# Patient Record
Sex: Female | Born: 1965 | Race: Black or African American | Hispanic: No | Smoking: Never smoker
Health system: Southern US, Community
[De-identification: ages and names within clinical notes are randomized; demographics above are authoritative.]

## PROBLEM LIST (undated history)

## (undated) DIAGNOSIS — E041 Nontoxic single thyroid nodule: Secondary | ICD-10-CM

## (undated) DIAGNOSIS — D649 Anemia, unspecified: Secondary | ICD-10-CM

## (undated) DIAGNOSIS — R87619 Unspecified abnormal cytological findings in specimens from cervix uteri: Secondary | ICD-10-CM

## (undated) DIAGNOSIS — N979 Female infertility, unspecified: Secondary | ICD-10-CM

## (undated) DIAGNOSIS — B9689 Other specified bacterial agents as the cause of diseases classified elsewhere: Secondary | ICD-10-CM

## (undated) DIAGNOSIS — N760 Acute vaginitis: Secondary | ICD-10-CM

## (undated) DIAGNOSIS — R319 Hematuria, unspecified: Secondary | ICD-10-CM

## (undated) HISTORY — PX: CRYOTHERAPY: SHX1416

## (undated) HISTORY — DX: Nontoxic single thyroid nodule: E04.1

## (undated) HISTORY — DX: Anemia, unspecified: D64.9

## (undated) HISTORY — DX: Other specified bacterial agents as the cause of diseases classified elsewhere: B96.89

## (undated) HISTORY — DX: Acute vaginitis: N76.0

## (undated) HISTORY — DX: Female infertility, unspecified: N97.9

## (undated) HISTORY — DX: Unspecified abnormal cytological findings in specimens from cervix uteri: R87.619

## (undated) HISTORY — DX: Hematuria, unspecified: R31.9

---

## 1985-04-27 HISTORY — PX: GYNECOLOGIC CRYOSURGERY: SHX857

## 1993-05-28 HISTORY — PX: THYROID LOBECTOMY: SHX420

## 1998-07-04 ENCOUNTER — Other Ambulatory Visit: Admission: RE | Admit: 1998-07-04 | Discharge: 1998-07-04 | Payer: Self-pay | Admitting: Obstetrics and Gynecology

## 1999-07-25 ENCOUNTER — Other Ambulatory Visit: Admission: RE | Admit: 1999-07-25 | Discharge: 1999-07-25 | Payer: Self-pay | Admitting: Obstetrics and Gynecology

## 2000-08-10 ENCOUNTER — Encounter: Payer: Self-pay | Admitting: Obstetrics and Gynecology

## 2000-08-10 ENCOUNTER — Encounter: Admission: RE | Admit: 2000-08-10 | Discharge: 2000-08-10 | Payer: Self-pay | Admitting: Obstetrics and Gynecology

## 2001-08-08 ENCOUNTER — Other Ambulatory Visit: Admission: RE | Admit: 2001-08-08 | Discharge: 2001-08-08 | Payer: Self-pay | Admitting: *Deleted

## 2002-08-17 ENCOUNTER — Other Ambulatory Visit: Admission: RE | Admit: 2002-08-17 | Discharge: 2002-08-17 | Payer: Self-pay | Admitting: Obstetrics and Gynecology

## 2003-08-20 ENCOUNTER — Other Ambulatory Visit: Admission: RE | Admit: 2003-08-20 | Discharge: 2003-08-20 | Payer: Self-pay | Admitting: Obstetrics and Gynecology

## 2004-08-21 ENCOUNTER — Other Ambulatory Visit: Admission: RE | Admit: 2004-08-21 | Discharge: 2004-08-21 | Payer: Self-pay | Admitting: Obstetrics and Gynecology

## 2004-08-22 ENCOUNTER — Encounter: Admission: RE | Admit: 2004-08-22 | Discharge: 2004-08-22 | Payer: Self-pay | Admitting: Obstetrics and Gynecology

## 2005-08-21 ENCOUNTER — Other Ambulatory Visit: Admission: RE | Admit: 2005-08-21 | Discharge: 2005-08-21 | Payer: Self-pay | Admitting: Obstetrics and Gynecology

## 2005-08-25 ENCOUNTER — Encounter: Admission: RE | Admit: 2005-08-25 | Discharge: 2005-08-25 | Payer: Self-pay | Admitting: Obstetrics and Gynecology

## 2005-09-14 ENCOUNTER — Encounter: Admission: RE | Admit: 2005-09-14 | Discharge: 2005-09-14 | Payer: Self-pay | Admitting: Obstetrics and Gynecology

## 2006-03-02 ENCOUNTER — Encounter: Admission: RE | Admit: 2006-03-02 | Discharge: 2006-03-02 | Payer: Self-pay | Admitting: Unknown Physician Specialty

## 2006-08-23 ENCOUNTER — Other Ambulatory Visit: Admission: RE | Admit: 2006-08-23 | Discharge: 2006-08-23 | Payer: Self-pay | Admitting: Obstetrics and Gynecology

## 2006-08-31 ENCOUNTER — Encounter: Admission: RE | Admit: 2006-08-31 | Discharge: 2006-08-31 | Payer: Self-pay | Admitting: Obstetrics and Gynecology

## 2007-09-05 ENCOUNTER — Encounter: Admission: RE | Admit: 2007-09-05 | Discharge: 2007-09-05 | Payer: Self-pay | Admitting: Obstetrics and Gynecology

## 2007-11-15 ENCOUNTER — Encounter: Admission: RE | Admit: 2007-11-15 | Discharge: 2007-11-15 | Payer: Self-pay | Admitting: Endocrinology

## 2008-07-03 ENCOUNTER — Other Ambulatory Visit: Admission: RE | Admit: 2008-07-03 | Discharge: 2008-07-03 | Payer: Self-pay | Admitting: Obstetrics & Gynecology

## 2008-09-05 ENCOUNTER — Encounter: Admission: RE | Admit: 2008-09-05 | Discharge: 2008-09-05 | Payer: Self-pay | Admitting: Obstetrics and Gynecology

## 2009-09-06 ENCOUNTER — Encounter: Admission: RE | Admit: 2009-09-06 | Discharge: 2009-09-06 | Payer: Self-pay | Admitting: Obstetrics and Gynecology

## 2010-08-06 ENCOUNTER — Other Ambulatory Visit: Payer: Self-pay | Admitting: Obstetrics and Gynecology

## 2010-08-06 DIAGNOSIS — Z1231 Encounter for screening mammogram for malignant neoplasm of breast: Secondary | ICD-10-CM

## 2010-09-08 ENCOUNTER — Ambulatory Visit
Admission: RE | Admit: 2010-09-08 | Discharge: 2010-09-08 | Disposition: A | Discharge status: Home / Self Care | Payer: BC Managed Care – PPO | Source: Ambulatory Visit | Attending: Obstetrics and Gynecology | Admitting: Obstetrics and Gynecology

## 2010-09-08 DIAGNOSIS — Z1231 Encounter for screening mammogram for malignant neoplasm of breast: Secondary | ICD-10-CM

## 2011-08-11 LAB — HM PAP SMEAR: HM Pap smear: NEGATIVE

## 2011-09-01 ENCOUNTER — Other Ambulatory Visit: Payer: Self-pay | Admitting: Obstetrics and Gynecology

## 2011-09-01 DIAGNOSIS — Z1231 Encounter for screening mammogram for malignant neoplasm of breast: Secondary | ICD-10-CM

## 2011-09-10 ENCOUNTER — Ambulatory Visit
Admission: RE | Admit: 2011-09-10 | Discharge: 2011-09-10 | Disposition: A | Discharge status: Home / Self Care | Payer: BC Managed Care – PPO | Source: Ambulatory Visit | Attending: Obstetrics and Gynecology | Admitting: Obstetrics and Gynecology

## 2011-09-10 DIAGNOSIS — Z1231 Encounter for screening mammogram for malignant neoplasm of breast: Secondary | ICD-10-CM

## 2011-09-10 LAB — HM MAMMOGRAPHY: HM Mammogram: NEGATIVE

## 2012-08-01 ENCOUNTER — Other Ambulatory Visit: Payer: Self-pay | Admitting: Nurse Practitioner

## 2012-08-01 NOTE — Telephone Encounter (Signed)
Annual Exam 08/15/12

## 2012-08-09 ENCOUNTER — Other Ambulatory Visit: Payer: Self-pay

## 2012-08-09 DIAGNOSIS — Z1231 Encounter for screening mammogram for malignant neoplasm of breast: Secondary | ICD-10-CM

## 2012-08-15 ENCOUNTER — Encounter: Payer: Self-pay | Admitting: *Deleted

## 2012-08-15 ENCOUNTER — Encounter: Payer: Self-pay | Admitting: Nurse Practitioner

## 2012-08-15 ENCOUNTER — Ambulatory Visit (INDEPENDENT_AMBULATORY_CARE_PROVIDER_SITE_OTHER): Payer: PRIVATE HEALTH INSURANCE | Admitting: Nurse Practitioner

## 2012-08-15 VITALS — BP 112/64 | HR 68 | Ht 64.0 in | Wt 159.8 lb

## 2012-08-15 DIAGNOSIS — Z0000 Encounter for general adult medical examination without abnormal findings: Secondary | ICD-10-CM

## 2012-08-15 DIAGNOSIS — IMO0001 Reserved for inherently not codable concepts without codable children: Secondary | ICD-10-CM

## 2012-08-15 DIAGNOSIS — E039 Hypothyroidism, unspecified: Secondary | ICD-10-CM

## 2012-08-15 DIAGNOSIS — Z309 Encounter for contraceptive management, unspecified: Secondary | ICD-10-CM

## 2012-08-15 DIAGNOSIS — Z01419 Encounter for gynecological examination (general) (routine) without abnormal findings: Secondary | ICD-10-CM

## 2012-08-15 LAB — POCT URINALYSIS DIPSTICK
Leukocytes, UA: NEGATIVE
Spec Grav, UA: 1.01
Urobilinogen, UA: NEGATIVE
pH, UA: 7

## 2012-08-15 MED ORDER — DROSPIRENONE-ETHINYL ESTRADIOL 3-0.02 MG PO TABS: 1.0000 | ORAL_TABLET | Freq: Every day | ORAL | Status: DC

## 2012-08-15 MED ORDER — LEVOTHYROXINE SODIUM 75 MCG PO TABS: 75.0000 ug | ORAL_TABLET | Freq: Every day | ORAL | Status: DC

## 2012-08-15 NOTE — Progress Notes (Signed)
47 y.o. G1P1 Married African American Fe here for annual exam. Menses are regular at 28 days, last 3 days.  Moderate to light. Still cramps for 2 days.  Labs done at work and will get TSH results sent here. No new health problems. Has history of anemia and admits to having a poor diet.  No LMP recorded.          Sexually active: yes  The current method of family planning is OCP (estrogen/progesterone).    Exercising: yes  Home exercise routine includes dance, cardio. Smoker:  no  Health Maintenance: Pap:  08/11/2011 normal with neg HR HPV MMG:  09/10/2011 normal Colonoscopy:  never TDaP:  08/11/2011 Labs: Hgb-10.9     Past Medical History  Diagnosis Date  . Infertility, female   . Bacterial vaginosis   . Hematuria   . Anemia   . Thyroid nodule     LEFT 7/09    Past Surgical History  Procedure Laterality Date  . Thyroid lobectomy Right 2/95  . Gynecologic cryosurgery  1987    Current Outpatient Prescriptions  Medication Sig Dispense Refill  . Levothyroxine Sodium (SYNTHROID PO) Take 0.75 mcg by mouth daily.      Devonne Doughty 3-0.02 MG tablet take 1 tablet by mouth daily  84 tablet  0  . Multiple Vitamin (MULTI-VITAMIN PO) Take by mouth daily.       No current facility-administered medications for this visit.    Family History  Problem Relation Age of Onset  . Cancer Mother 79    ovarian cancer  . Diabetes Mother   . Cancer Father 55    prostate cancer    ROS:  Pertinent items are noted in HPI.  Otherwise, a comprehensive ROS was negative.  Exam:   There were no vitals taken for this visit.    Ht Readings from Last 3 Encounters:  No data found for Ht    General appearance: alert, cooperative and appears stated age Head: Normocephalic, without obvious abnormality, atraumatic Neck: no adenopathy, supple, symmetrical, trachea midline and thyroid normal to inspection and palpation Lungs: clear to auscultation bilaterally Breasts: normal appearance, no masses or  tenderness Heart: regular rate and rhythm Abdomen: soft, non-tender; no masses,  no organomegaly Extremities: extremities normal, atraumatic, no cyanosis or edema Skin: Skin color, texture, turgor normal. No rashes or lesions Lymph nodes: Cervical, supraclavicular, and axillary nodes normal. No abnormal inguinal nodes palpated Neurologic: Grossly normal   Pelvic: External genitalia:  no lesions              Urethra:  normal appearing urethra with no masses, tenderness or lesions              Bartholin's and Skene's: normal                 Vagina: normal appearing vagina with normal color and discharge, no lesions              Cervix: anteverted              Pap taken: no Bimanual Exam:  Uterus:  normal size, contour, position, consistency, mobility, non-tender              Adnexa: no mass, fullness, tenderness               Rectovaginal: Confirms               Anus:  normal sphincter tone, no lesions  A:  Well Woman with normal  exam  Oral contraception  History of anemia  Hypothyroid on replacement therapy    P:   Mammogram as scheduled  pap smear as per guidelines  return annually or prn  An After Visit Summary was printed and given to the patient.

## 2012-08-15 NOTE — Patient Instructions (Addendum)
EXERCISE AND DIET:  We recommended that you start or continue a regular exercise program for good health. Regular exercise means any activity that makes your heart beat faster and makes you sweat.  We recommend exercising at least 30 minutes per day at least 3 days a week, preferably 4 or 5.  We also recommend a diet low in fat and sugar.  Inactivity, poor dietary choices and obesity can cause diabetes, heart attack, stroke, and kidney damage, among others.    ALCOHOL AND SMOKING:  Women should limit their alcohol intake to no more than 7 drinks/beers/glasses of wine (combined, not each!) per week. Moderation of alcohol intake to this level decreases your risk of breast cancer and liver damage. And of course, no recreational drugs are part of a healthy lifestyle.  And absolutely no smoking or even second hand smoke. Most people know smoking can cause heart and lung diseases, but did you know it also contributes to weakening of your bones? Aging of your skin?  Yellowing of your teeth and nails?  CALCIUM AND VITAMIN D:  Adequate intake of calcium and Vitamin D are recommended.  The recommendations for exact amounts of these supplements seem to change often, but generally speaking 600 mg of calcium (either carbonate or citrate) and 800 units of Vitamin D per day seems prudent. Certain women may benefit from higher intake of Vitamin D.  If you are among these women, your doctor will have told you during your visit.    PAP SMEARS:  Pap smears, to check for cervical cancer or precancers,  have traditionally been done yearly, although recent scientific advances have shown that most women can have pap smears less often.  However, every woman still should have a physical exam from her gynecologist every year. It will include a breast check, inspection of the vulva and vagina to check for abnormal growths or skin changes, a visual exam of the cervix, and then an exam to evaluate the size and shape of the uterus and  ovaries.  And after 48 years of age, a rectal exam is indicated to check for rectal cancers. We will also provide age appropriate advice regarding health maintenance, like when you should have certain vaccines, screening for sexually transmitted diseases, bone density testing, colonoscopy, mammograms, etc.   MAMMOGRAMS:  All women over 40 years old should have a yearly mammogram. Many facilities now offer a "3D" mammogram, which may cost around $50 extra out of pocket. If possible,  we recommend you accept the option to have the 3D mammogram performed.  It both reduces the number of women who will be called back for extra views which then turn out to be normal, and it is better than the routine mammogram at detecting truly abnormal areas.    COLONOSCOPY:  Colonoscopy to screen for colon cancer is recommended for all women at age 50.  We know, you hate the idea of the prep.  We agree, BUT, having colon cancer and not knowing it is worse!!  Colon cancer so often starts as a polyp that can be seen and removed at colonscopy, which can quite literally save your life!  And if your first colonoscopy is normal and you have no family history of colon cancer, most women don't have to have it again for 10 years.  Once every ten years, you can do something that may end up saving your life, right?  We will be happy to help you get it scheduled when you are ready.    Be sure to check your insurance coverage so you understand how much it will cost.  It may be covered as a preventative service at no cost, but you should check your particular policy.    Anemia, Frequently Asked Questions WHAT ARE THE SYMPTOMS OF ANEMIA?  Headache.  Difficulty thinking.  Fatigue.  Shortness of breath.  Weakness.  Rapid heartbeat. AT WHAT POINT ARE PEOPLE CONSIDERED ANEMIC?  This varies with gender and age.   Both hemoglobin (Hgb) and hematocrit values are used to define anemia. These lab values are obtained from a complete blood  count (CBC) test. This is performed at a caregiver's office.  The normal range of hemoglobin values for adult men is 14.0 g/dL to 16.1 g/dL. For nonpregnant women, values are 12.3 g/dL to 09.6 g/dL.  The World Health Organization defines anemia as less than 12 g/dL for nonpregnant women and less than 13 g/dL for men.  For adult males, the average normal hematocrit is 46%, and the range is 40% to 52%.  For adult females, the average normal hematocrit is 41%, and the range is 35% to 47%.  Values that fall below the lower limits can be a sign of anemia and should have further checking (evaluation). GROUPS OF PEOPLE WHO ARE AT RISK FOR DEVELOPING ANEMIA INCLUDE:   Infants who are breastfed or taking a formula that is not fortified with iron.  Children going through a rapid growth spurt. The iron available can not keep up with the needs for a red cell mass which must grow with the child.  Women in childbearing years. They need iron because of blood loss during menstruation.  Pregnant women. The growing fetus creates a high demand for iron.  People with ongoing gastrointestinal blood loss are at risk of developing iron deficiency.  Individuals with leukemia or cancer who must receive chemotherapy or radiation to treat their disease. The drugs or radiation used to treat these diseases often decreases the bone marrow's ability to make cells of all classes. This includes red blood cells, white blood cells, and platelets.  Individuals with chronic inflammatory conditions such as rheumatoid arthritis or chronic infections.  The elderly. ARE SOME TYPES OF ANEMIA INHERITED?   Yes, some types of anemia are due to inherited or genetic defects.  Sickle cell anemia. This occurs most often in people of African, African American, and Mediterranean descent.  Thalassemia (or Cooley's anemia). This type is found in people of Mediterranean and Southeast Asian descent. These types of anemia are  common.  [place]i. This is rare. CAN CERTAIN MEDICATIONS CAUSE A PERSON TO BECOME ANEMIC?  Yes. For example, drugs to fight cancer (chemotherapeutic agents) often cause anemia. These drugs can slow the bone marrow's ability to make red blood cells. If there are not enough red blood cells, the body does not get enough oxygen. WHAT HEMATOCRIT LEVEL IS REQUIRED TO DONATE BLOOD?  The lower limit of an acceptable hematocrit for blood donors is 38%. If you have a low hematocrit value, you should schedule an appointment with your caregiver. ARE BLOOD TRANSFUSIONS COMMONLY USED TO CORRECT ANEMIA, AND ARE THEY DANGEROUS?  They are used to treat anemia as a last resort. Your caregiver will find the cause of the anemia and correct it if possible. Most blood transfusions are given because of excessive bleeding at the time of surgery, with trauma, or because of bone marrow suppression in patients with cancer or leukemia on chemotherapy. Blood transfusions are safer than ever before. We also know  that blood transfusions affect the immune system and may increase certain risks. There is also a concern for human error. In 1/16,000 transfusions, a patient receives a transfusion of blood that is not matched with his or her blood type.  WHAT IS IRON DEFICIENCY ANEMIA AND CAN I CORRECT IT BY CHANGING MY DIET?  Iron is an essential part of hemoglobin. Without enough hemoglobin, anemia develops and the body does not get the right amount of oxygen. Iron deficiency anemia develops after the body has had a low level of iron for a long time. This is either caused by blood loss, not taking in or absorbing enough iron, or increased demands for iron (like pregnancy or rapid growth).  Foods from animal origin such as beef, chicken, and pork, are good sources of iron. Be sure to have one of these foods at each meal. Vitamin C helps your body absorb iron. Foods rich in Vitamin C include citrus, bell pepper, strawberries, spinach and  cantaloupe. In some cases, iron supplements may be needed in order to correct the iron deficiency. In the case of poor absorption, extra iron may have to be given directly into the vein through a needle (intravenously). I HAVE BEEN DIAGNOSED WITH IRON DEFICIENCY ANEMIA AND MY CAREGIVER PRESCRIBED IRON SUPPLEMENTS. HOW LONG WILL IT TAKE FOR MY BLOOD TO BECOME NORMAL?  It depends on the degree of anemia at the beginning of treatment. Most people with mild to moderate iron deficiency, anemia will correct the anemia over a period of 2 to 3 months. But after the anemia is corrected, the iron stored by the body is still low. Caregivers often suggest an additional 6 months of oral iron therapy once the anemia has been reversed. This will help prevent the iron deficiency anemia from quickly happening again. Non-anemic adult males should take iron supplements only under the direction of a doctor, too much iron can cause liver damage.  MY HEMOGLOBIN IS 9 G/DL AND I AM SCHEDULED FOR SURGERY. SHOULD I POSTPONE THE SURGERY?  If you have Hgb of 9, you should discuss this with your caregiver right away. Many patients with similar hemoglobin levels have had surgery without problems. If minimal blood loss is expected for a minor procedure, no treatment may be necessary.  If a greater blood loss is expected for more extensive procedures, you should ask your caregiver about being treated with erythropoietin and iron. This is to accelerate the recovery of your hemoglobin to a normal level before surgery. An anemic patient who undergoes high-blood-loss surgery has a greater risk of surgical complications and need for a blood transfusion, which also carries some risk.  I HAVE BEEN TOLD THAT HEAVY MENSTRUAL PERIODS CAUSE ANEMIA. IS THERE ANYTHING I CAN DO TO PREVENT THE ANEMIA?  Anemia that results from heavy periods is usually due to iron deficiency. You can try to meet the increased demands for iron caused by the heavy monthly  blood loss by increasing the intake of iron-rich foods. Iron supplements may be required. Discuss your concerns with your caregiver. WHAT CAUSES ANEMIA DURING PREGNANCY?  Pregnancy places major demands on the body. The mother must meet the needs of both her body and her growing baby. The body needs enough iron and folate to make the right amount of red blood cells. To prevent anemia while pregnant, the mother should stay in close contact with her caregiver.  Be sure to eat a diet that has foods rich in iron and folate like liver and dark green  leafy vegetables. Folate plays an important role in the normal development of a baby's spinal cord. Folate can help prevent serious disorders like spina bifida. If your diet does not provide adequate nutrients, you may want to talk with your caregiver about nutritional supplements.  WHAT IS THE RELATIONSHIP BETWEEN FIBROID TUMORS AND ANEMIA IN WOMEN?  The relationship is usually caused by the increased menstrual blood loss caused by fibroids. Good iron intake may be required to prevent iron deficiency anemia from developing.  Document Released: 11/20/2003 Document Revised: 07/06/2011 Document Reviewed: 05/06/2010 [facility] Patient Information 2013 [place], [place].

## 2012-08-16 LAB — HEMOGLOBIN, FINGERSTICK: Hemoglobin, fingerstick: 10.9 g/dL — ABNORMAL LOW (ref 12.0–16.0)

## 2012-08-16 NOTE — Progress Notes (Signed)
Encounter reviewed by Dr. Brook Silva.  

## 2012-09-12 ENCOUNTER — Ambulatory Visit
Admission: RE | Admit: 2012-09-12 | Discharge: 2012-09-12 | Disposition: A | Discharge status: Home / Self Care | Payer: PRIVATE HEALTH INSURANCE | Source: Ambulatory Visit

## 2012-09-12 DIAGNOSIS — Z1231 Encounter for screening mammogram for malignant neoplasm of breast: Secondary | ICD-10-CM

## 2013-08-17 ENCOUNTER — Ambulatory Visit (INDEPENDENT_AMBULATORY_CARE_PROVIDER_SITE_OTHER): Payer: PRIVATE HEALTH INSURANCE | Admitting: Nurse Practitioner

## 2013-08-17 ENCOUNTER — Encounter: Payer: Self-pay | Admitting: Nurse Practitioner

## 2013-08-17 VITALS — BP 130/82 | HR 60 | Ht 63.25 in | Wt 167.0 lb

## 2013-08-17 DIAGNOSIS — E039 Hypothyroidism, unspecified: Secondary | ICD-10-CM

## 2013-08-17 DIAGNOSIS — Z01419 Encounter for gynecological examination (general) (routine) without abnormal findings: Secondary | ICD-10-CM

## 2013-08-17 DIAGNOSIS — Z309 Encounter for contraceptive management, unspecified: Secondary | ICD-10-CM

## 2013-08-17 DIAGNOSIS — IMO0001 Reserved for inherently not codable concepts without codable children: Secondary | ICD-10-CM

## 2013-08-17 DIAGNOSIS — Z0000 Encounter for general adult medical examination without abnormal findings: Secondary | ICD-10-CM

## 2013-08-17 LAB — HEMOGLOBIN, FINGERSTICK: Hemoglobin, fingerstick: 10.8 g/dL — ABNORMAL LOW (ref 12.0–16.0)

## 2013-08-17 LAB — POCT URINALYSIS DIPSTICK
Bilirubin, UA: NEGATIVE
Glucose, UA: NEGATIVE
Ketones, UA: NEGATIVE
Leukocytes, UA: NEGATIVE
Nitrite, UA: NEGATIVE
Protein, UA: NEGATIVE
Urobilinogen, UA: NEGATIVE
pH, UA: 5

## 2013-08-17 MED ORDER — LEVOTHYROXINE SODIUM 75 MCG PO TABS: 75.0000 ug | ORAL_TABLET | Freq: Every day | ORAL | Status: DC

## 2013-08-17 MED ORDER — DROSPIRENONE-ETHINYL ESTRADIOL 3-0.02 MG PO TABS: 1.0000 | ORAL_TABLET | Freq: Every day | ORAL | Status: DC

## 2013-08-17 NOTE — Patient Instructions (Signed)

## 2013-08-17 NOTE — Progress Notes (Signed)
Patient ID: Dawn Trevino, female   DOB: 1965/10/14, 48 y.o.   MRN: [id number] 48 y.o. G1P1 Married African American Fe here for annual exam.  Menses last very light for 2-3 days.  Noted more cramps past 2 months.  Patient's last menstrual period was 08/13/2013.          Sexually active: yes  The current method of family planning is OCP (estrogen/progesterone).    Exercising: yes  zumba and walking Smoker:  no  Health Maintenance: Pap:  08/11/2011 WNL, neg HR HPV MMG:  09/13/2012 normal TDaP:  08/11/2011 Labs: HB:  10.8 Urine:  Large RBC - on menses    reports that she has never smoked. She has never used smokeless tobacco. She reports that she drinks about 1.2 ounces of alcohol per week. She reports that she does not use illicit drugs.  Past Medical History  Diagnosis Date  . Infertility, female   . Bacterial vaginosis   . Hematuria   . Anemia   . Thyroid nodule     LEFT 7/09    Past Surgical History  Procedure Laterality Date  . Thyroid lobectomy Right 2/95  . Gynecologic cryosurgery  1987    Current Outpatient Prescriptions  Medication Sig Dispense Refill  . drospirenone-ethinyl estradiol (LORYNA) 3-0.02 MG tablet Take 1 tablet by mouth daily.  84 tablet  3  . levothyroxine (SYNTHROID) 75 MCG tablet Take 1 tablet (75 mcg total) by mouth daily before breakfast.  90 tablet  3  . Multiple Vitamin (MULTI-VITAMIN PO) Take by mouth daily.       No current facility-administered medications for this visit.    Family History  Problem Relation Age of Onset  . Cancer Mother 22    ovarian cancer  . Diabetes Mother   . Cancer Father 33    prostate cancer  . Cancer Brother 41    liver cancer    ROS:  Pertinent items are noted in HPI.  Otherwise, a comprehensive ROS was negative.  Exam:   BP 130/82  Pulse 60  Ht 5' 3.25" (1.607 m)  Wt 167 lb (75.751 kg)  BMI 29.33 kg/m2  LMP 08/13/2013 Height: 5' 3.25" (160.7 cm)  Ht Readings from Last 3 Encounters:  08/17/13 5'  3.25" (1.607 m)  08/15/12 5\' 4"  (1.626 m)    General appearance: alert, cooperative and appears stated age Head: Normocephalic, without obvious abnormality, atraumatic Neck: no adenopathy, supple, symmetrical, trachea midline and thyroid normal to inspection and palpation Lungs: clear to auscultation bilaterally Breasts: normal appearance, no masses or tenderness Heart: regular rate and rhythm Abdomen: soft, non-tender; no masses,  no organomegaly Extremities: extremities normal, atraumatic, no cyanosis or edema Skin: Skin color, texture, turgor normal. No rashes or lesions Lymph nodes: Cervical, supraclavicular, and axillary nodes normal. No abnormal inguinal nodes palpated Neurologic: Grossly normal   Pelvic: External genitalia:  no lesions              Urethra:  normal appearing urethra with no masses, tenderness or lesions              Bartholin's and Skene's: normal                 Vagina: normal appearing vagina with normal color and discharge, no lesions              Cervix: anteverted              Pap taken: no Bimanual Exam:  Uterus:  normal  size, contour, position, consistency, mobility, non-tender              Adnexa: no mass, fullness, tenderness               Rectovaginal: Confirms               Anus:  normal sphincter tone, no lesions  A:  Well Woman with normal exam  Contraception with OCP  History of anemia - currently off Fe  History of hypothyroid on replacement  History of hematuria with negative evaluation  P:   Reviewed health and wellness pertinent to exam  Pap smear not taken today  Mammogram due 08/2013  Refill OCP - Loryna for a year  Refill on Synthroid and follow with labs  Will get copy of labs from 01/2013 at work and send here  [place] on breast self exam, mammography screening, use and side effects of OCP's, adequate intake of calcium and vitamin D, diet and exercise return annually or prn  An After Visit Summary was printed and given to the  patient.

## 2013-08-18 LAB — TSH: TSH: 1.013 u[IU]/mL (ref 0.350–4.500)

## 2013-08-20 NOTE — Progress Notes (Signed)
Encounter reviewed by Dr. Cleave Ternes Silva.  

## 2013-08-22 ENCOUNTER — Other Ambulatory Visit: Payer: Self-pay

## 2013-08-22 DIAGNOSIS — Z1231 Encounter for screening mammogram for malignant neoplasm of breast: Secondary | ICD-10-CM

## 2013-09-13 ENCOUNTER — Ambulatory Visit
Admission: RE | Admit: 2013-09-13 | Discharge: 2013-09-13 | Disposition: A | Discharge status: Home / Self Care | Payer: PRIVATE HEALTH INSURANCE | Source: Ambulatory Visit

## 2013-09-13 DIAGNOSIS — Z1231 Encounter for screening mammogram for malignant neoplasm of breast: Secondary | ICD-10-CM

## 2013-09-14 ENCOUNTER — Other Ambulatory Visit: Payer: Self-pay | Admitting: Nurse Practitioner

## 2013-09-14 DIAGNOSIS — R928 Other abnormal and inconclusive findings on diagnostic imaging of breast: Secondary | ICD-10-CM

## 2013-09-27 ENCOUNTER — Other Ambulatory Visit: Payer: Self-pay | Admitting: Nurse Practitioner

## 2013-09-27 ENCOUNTER — Ambulatory Visit
Admission: RE | Admit: 2013-09-27 | Discharge: 2013-09-27 | Disposition: A | Discharge status: Home / Self Care | Payer: PRIVATE HEALTH INSURANCE | Source: Ambulatory Visit | Attending: Nurse Practitioner | Admitting: Nurse Practitioner

## 2013-09-27 ENCOUNTER — Encounter (INDEPENDENT_AMBULATORY_CARE_PROVIDER_SITE_OTHER): Payer: Self-pay

## 2013-09-27 DIAGNOSIS — R928 Other abnormal and inconclusive findings on diagnostic imaging of breast: Secondary | ICD-10-CM

## 2014-02-26 ENCOUNTER — Encounter: Payer: Self-pay | Admitting: Nurse Practitioner

## 2014-08-21 ENCOUNTER — Encounter: Payer: Self-pay | Admitting: Nurse Practitioner

## 2014-08-21 ENCOUNTER — Ambulatory Visit (INDEPENDENT_AMBULATORY_CARE_PROVIDER_SITE_OTHER): Payer: PRIVATE HEALTH INSURANCE | Admitting: Nurse Practitioner

## 2014-08-21 VITALS — BP 108/66 | HR 64 | Ht 63.0 in | Wt 169.0 lb

## 2014-08-21 DIAGNOSIS — Z0000 Encounter for general adult medical examination without abnormal findings: Secondary | ICD-10-CM | POA: Diagnosis not present

## 2014-08-21 DIAGNOSIS — Z01419 Encounter for gynecological examination (general) (routine) without abnormal findings: Secondary | ICD-10-CM | POA: Diagnosis not present

## 2014-08-21 DIAGNOSIS — Z308 Encounter for other contraceptive management: Secondary | ICD-10-CM

## 2014-08-21 DIAGNOSIS — Z1211 Encounter for screening for malignant neoplasm of colon: Secondary | ICD-10-CM | POA: Diagnosis not present

## 2014-08-21 MED ORDER — DROSPIRENONE-ETHINYL ESTRADIOL 3-0.02 MG PO TABS: 1.0000 | ORAL_TABLET | Freq: Every day | ORAL | Status: DC

## 2014-08-21 NOTE — Progress Notes (Signed)
Patient ID: Dawn Trevino, female   DOB: 22-Jul-1965, 49 y.o.   MRN: [id number] 49 y.o. G1P1 Married  African American Fe here for annual exam.  No new health problems.  Menses is short about 3-4 days and light. No vaso symptoms.  Patient's last menstrual period was 08/18/2014 (exact date).          Sexually active: Yes.    The current method of family planning is OCP (estrogen/progesterone).    Exercising: Yes.    walking and zumba Smoker:  no  Health Maintenance: Pap:  08/11/11, negative with neg HR HPV MMG:  09/13/13 with diagnostic right on 09/27/13; Bi-Rads 1:  Negative, screening in one year Colonoscopy:  Due  TDaP:  08/11/11 Labs:  HB:  Work labs  Urine:  declined   reports that she has never smoked. She has never used smokeless tobacco. She reports that she drinks about 1.2 oz of alcohol per week. She reports that she does not use illicit drugs.  Past Medical History  Diagnosis Date  . Infertility, female   . Bacterial vaginosis   . Hematuria   . Anemia   . Thyroid nodule     LEFT 7/09    Past Surgical History  Procedure Laterality Date  . Thyroid lobectomy Right 2/95  . Gynecologic cryosurgery  1987    abnormal pap - normal since    Current Outpatient Prescriptions  Medication Sig Dispense Refill  . drospirenone-ethinyl estradiol (LORYNA) 3-0.02 MG tablet Take 1 tablet by mouth daily. 3 Package 3  . levothyroxine (SYNTHROID) 75 MCG tablet Take 1 tablet (75 mcg total) by mouth daily before breakfast. 90 tablet 3  . Multiple Vitamin (MULTI-VITAMIN PO) Take by mouth daily.     No current facility-administered medications for this visit.    Family History  Problem Relation Age of Onset  . Ovarian cancer Mother 40    ovarian cancer  . Diabetes Mother   . Prostate cancer Father 59    prostate cancer  . Liver cancer Brother 60    liver cancer  . Kidney disease Sister     both kidneys removed  . Prostate cancer Brother     ROS:  Pertinent items are noted in HPI.   Otherwise, a comprehensive ROS was negative.  Exam:   BP 108/66 mmHg  Pulse 64  Ht 5\' 3"  (1.6 m)  Wt 169 lb (76.658 kg)  BMI 29.94 kg/m2  LMP 08/18/2014 (Exact Date) Height: 5\' 3"  (160 cm) Ht Readings from Last 3 Encounters:  08/21/14 5\' 3"  (1.6 m)  08/17/13 5' 3.25" (1.607 m)  08/15/12 5\' 4"  (1.626 m)    General appearance: alert, cooperative and appears stated age Head: Normocephalic, without obvious abnormality, atraumatic Neck: no adenopathy, supple, symmetrical, trachea midline and thyroid normal to inspection and palpation Lungs: clear to auscultation bilaterally Breasts: normal appearance, no masses or tenderness Heart: regular rate and rhythm Abdomen: soft, non-tender; no masses,  no organomegaly Extremities: extremities normal, atraumatic, no cyanosis or edema Skin: Skin color, texture, turgor normal. No rashes or lesions Lymph nodes: Cervical, supraclavicular, and axillary nodes normal. No abnormal inguinal nodes palpated Neurologic: Grossly normal   Pelvic: External genitalia:  no lesions              Urethra:  normal appearing urethra with no masses, tenderness or lesions              Bartholin's and Skene's: normal  Vagina: normal appearing vagina with normal color and discharge, no lesions              Cervix: anteverted              Pap taken: Yes.   Bimanual Exam:  Uterus:  normal size, contour, position, consistency, mobility, non-tender              Adnexa: no mass, fullness, tenderness               Rectovaginal: Confirms               Anus:  normal sphincter tone, no lesions  Chaperone present: yes  A:  Well Woman with normal exam  Contraception with OCP History of anemia - currently off Fe History of hypothyroid on replacement History of hematuria with negative evaluation   P:   Reviewed health and wellness pertinent to exam  Pap smear taken today  Mammogram  Is due 08/2014  Refill OCP - Loryna  for a year  Will get GI consult with Dr. Collene Mares for screening colonoscopy  Counseled on breast self exam, mammography screening, use and side effects of OCP's, adequate intake of calcium and vitamin D, diet and exercise return annually or prn  An After Visit Summary was printed and given to the patient.

## 2014-08-21 NOTE — Patient Instructions (Signed)

## 2014-08-23 NOTE — Progress Notes (Signed)
Encounter reviewed by Dr. Brook Silva.  

## 2014-08-24 ENCOUNTER — Telehealth: Payer: Self-pay | Admitting: Nurse Practitioner

## 2014-08-24 LAB — IPS PAP TEST WITH HPV

## 2014-08-24 NOTE — Telephone Encounter (Signed)
Call to patient. Advised that she is scheduled with Dr Collene Mares at [facility] on Sep 04, 2014. Also provided her with their contact information. Patient agreeable.

## 2014-09-14 ENCOUNTER — Other Ambulatory Visit: Payer: Self-pay | Admitting: Nurse Practitioner

## 2014-09-14 NOTE — Telephone Encounter (Signed)
Medication refill request: Synthroid 75 mcg Last AEX:  08/21/14 with PG Next AEX: 08/27/15 with PG Last MMG (if hormonal medication request): n/a Refill authorized: #[id number] rfs, please advise.

## 2014-09-14 NOTE — Telephone Encounter (Signed)
levothyroxine (SYNTHROID) 75 MCG tablet [[id number]]     Order Details    Dose: 75 mcg Route: Oral Frequency: Daily before breakfast  Dispense Quantity:  90 tablet Refills:  3 Fills Remaining:  3        Sig: Take 1 tablet (75 mcg total) by mouth daily before breakfast.       Written Date:  08/17/13 Expiration Date:  08/17/14    Start Date:  08/17/13 End Date:  --    Ordering Provider:  -- Authorizing Provider:  Kem Boroughs, FNP Ordering User:  Milford Cage, FNP

## 2014-09-14 NOTE — Telephone Encounter (Signed)
rx was from 08/17/13 instead of this year.

## 2014-09-14 NOTE — Telephone Encounter (Signed)
At last AEX she got RX for a year.

## 2014-09-18 NOTE — Telephone Encounter (Signed)
Ms. Dawn Trevino please advise rx was from last year not sent for this year.

## 2014-09-19 NOTE — Telephone Encounter (Signed)
From reviewing chart she did not have TSH check prior to renewal, did not see where it was done at annual. Will need to come in for lab. Let me know if she had somewhere else and will need copy.

## 2014-09-20 MED ORDER — LEVOTHYROXINE SODIUM 75 MCG PO TABS: 75.0000 ug | ORAL_TABLET | Freq: Every day | ORAL | Status: DC

## 2014-09-20 NOTE — Addendum Note (Signed)
Addended by: Alfonzo Feller on: 09/20/2014 12:11 PM   Modules accepted: Orders

## 2014-09-20 NOTE — Telephone Encounter (Signed)
Most recent TSH we have in the system is for 02/05/2014 level was at 1.330.  Per Ms. Patty patient needs to come in for TSH recheck because we need a more recent level and that we can send a refill to last her until appointment. Called and notified patient of this and scheduled her for 09/28/14 at 9:30. Synthroid 75 mcg #30/0 rfs sent to [facility] on [address], patient is aware.

## 2014-09-21 ENCOUNTER — Other Ambulatory Visit: Payer: Self-pay

## 2014-09-21 DIAGNOSIS — Z1231 Encounter for screening mammogram for malignant neoplasm of breast: Secondary | ICD-10-CM

## 2014-09-26 ENCOUNTER — Other Ambulatory Visit (INDEPENDENT_AMBULATORY_CARE_PROVIDER_SITE_OTHER): Payer: PRIVATE HEALTH INSURANCE

## 2014-09-26 DIAGNOSIS — E039 Hypothyroidism, unspecified: Secondary | ICD-10-CM

## 2014-09-26 LAB — TSH: TSH: 0.664 u[IU]/mL (ref 0.350–4.500)

## 2014-09-27 ENCOUNTER — Other Ambulatory Visit: Payer: Self-pay | Admitting: *Deleted

## 2014-09-27 MED ORDER — LEVOTHYROXINE SODIUM 75 MCG PO TABS: 75.0000 ug | ORAL_TABLET | Freq: Every day | ORAL | Status: DC

## 2014-09-28 ENCOUNTER — Other Ambulatory Visit: Payer: PRIVATE HEALTH INSURANCE

## 2014-10-22 ENCOUNTER — Ambulatory Visit
Admission: RE | Admit: 2014-10-22 | Discharge: 2014-10-22 | Disposition: A | Discharge status: Home / Self Care | Payer: PRIVATE HEALTH INSURANCE | Source: Ambulatory Visit

## 2014-10-22 DIAGNOSIS — Z1231 Encounter for screening mammogram for malignant neoplasm of breast: Secondary | ICD-10-CM

## 2015-01-22 ENCOUNTER — Other Ambulatory Visit: Payer: Self-pay

## 2015-08-22 ENCOUNTER — Other Ambulatory Visit: Payer: Self-pay | Admitting: Nurse Practitioner

## 2015-08-22 NOTE — Telephone Encounter (Signed)
Medication refill request: DROSPIRENONE-EE 3-0.02 MG TAB Last AEX:  08/21/14 with PG Next AEX: 08/27/15 with PG Last MMG (if hormonal medication request): 10/23/2014 BI-RADS 1: neg Refill authorized: #[id number]  Routed to DL since PG is out of the office today.

## 2015-08-27 ENCOUNTER — Encounter: Payer: Self-pay | Admitting: Nurse Practitioner

## 2015-08-27 ENCOUNTER — Ambulatory Visit (INDEPENDENT_AMBULATORY_CARE_PROVIDER_SITE_OTHER): Payer: PRIVATE HEALTH INSURANCE | Admitting: Nurse Practitioner

## 2015-08-27 VITALS — BP 122/80 | HR 72 | Ht 63.0 in | Wt 172.0 lb

## 2015-08-27 DIAGNOSIS — Z0000 Encounter for general adult medical examination without abnormal findings: Secondary | ICD-10-CM

## 2015-08-27 DIAGNOSIS — E559 Vitamin D deficiency, unspecified: Secondary | ICD-10-CM | POA: Diagnosis not present

## 2015-08-27 DIAGNOSIS — Z01419 Encounter for gynecological examination (general) (routine) without abnormal findings: Secondary | ICD-10-CM

## 2015-08-27 DIAGNOSIS — E039 Hypothyroidism, unspecified: Secondary | ICD-10-CM

## 2015-08-27 LAB — TSH: TSH: 1.11 mIU/L

## 2015-08-27 MED ORDER — IBUPROFEN 800 MG PO TABS: 800.0000 mg | ORAL_TABLET | Freq: Three times a day (TID) | ORAL | Status: DC | PRN

## 2015-08-27 MED ORDER — LEVOTHYROXINE SODIUM 75 MCG PO TABS: 75.0000 ug | ORAL_TABLET | Freq: Every day | ORAL | Status: DC

## 2015-08-27 MED ORDER — DROSPIRENONE-ETHINYL ESTRADIOL 3-0.02 MG PO TABS: 1.0000 | ORAL_TABLET | Freq: Every day | ORAL | Status: DC

## 2015-08-27 NOTE — Progress Notes (Signed)
Patient ID: Dawn Trevino, female   DOB: [birth year], 50 y.o.   MRN: [id number]  50 y.o. G39P0001 Married  Caucasian Fe here for annual exam.  Menses last 3-4 days. Flow is light cramps for 2 days and takes Ibuprofen 800 mg prn. This is the year for the 'girls cruise' - they go every other year.  Patient's last menstrual period was 07/31/2015 (exact date).          Sexually active: Yes.    The current method of family planning is OCP (estrogen/progesterone).    Exercising: Yes.    walking Smoker:  no  Health Maintenance: Pap: 08/11/2011 WNL, neg HR HPV MMG:10/22/14, 3D, Bi-Rads 1: Negative Colonoscopy: Dr. Collene Mares, repeat in 10 years TDaP: 08/11/11  HIV: done today Labs: Work, will fax copy  Urine:  Not done today   reports that she has never smoked. She has never used smokeless tobacco. She reports that she drinks about 1.2 oz of alcohol per week. She reports that she does not use illicit drugs.  Past Medical History  Diagnosis Date  . Infertility, female   . Bacterial vaginosis   . Hematuria   . Anemia   . Thyroid nodule     LEFT 7/09    Past Surgical History  Procedure Laterality Date  . Thyroid lobectomy Right 2/95  . Gynecologic cryosurgery  1987    abnormal pap - normal since    Current Outpatient Prescriptions  Medication Sig Dispense Refill  . drospirenone-ethinyl estradiol (YAZ,GIANVI,LORYNA) 3-0.02 MG tablet Take 1 tablet by mouth daily. 84 tablet 4  . levothyroxine (SYNTHROID) 75 MCG tablet Take 1 tablet (75 mcg total) by mouth daily before breakfast. 90 tablet 4  . Multiple Vitamin (MULTI-VITAMIN PO) Take by mouth daily.    Marland Kitchen ibuprofen (ADVIL,MOTRIN) 800 MG tablet Take 1 tablet (800 mg total) by mouth every 8 (eight) hours as needed. 30 tablet 6   No current facility-administered medications for this visit.    Family History  Problem Relation Age of Onset  . Ovarian cancer Mother 9    ovarian cancer  . Diabetes Mother   . Prostate cancer Father 43     prostate cancer  . Liver cancer Brother 48    liver cancer  . Kidney disease Sister     both kidneys removed  . Prostate cancer Brother     ROS:  Pertinent items are noted in HPI.  Otherwise, a comprehensive ROS was negative.  Exam:   BP 122/80 mmHg  Pulse 72  Ht 5\' 3"  (1.6 m)  Wt 172 lb (78.019 kg)  BMI 30.48 kg/m2  LMP 07/31/2015 (Exact Date) Height: 5\' 3"  (160 cm) Ht Readings from Last 3 Encounters:  08/27/15 5\' 3"  (1.6 m)  08/21/14 5\' 3"  (1.6 m)  08/17/13 5' 3.25" (1.607 m)    General appearance: alert, cooperative and appears stated age Head: Normocephalic, without obvious abnormality, atraumatic Neck: no adenopathy, supple, symmetrical, trachea midline and thyroid normal to inspection and palpation Lungs: clear to auscultation bilaterally Breasts: normal appearance, no masses or tenderness Heart: regular rate and rhythm Abdomen: soft, non-tender; no masses,  no organomegaly Extremities: extremities normal, atraumatic, no cyanosis or edema Skin: Skin color, texture, turgor normal. No rashes or lesions Lymph nodes: Cervical, supraclavicular, and axillary nodes normal. No abnormal inguinal nodes palpated Neurologic: Grossly normal   Pelvic: External genitalia:  no lesions              Urethra:  normal appearing urethra with  no masses, tenderness or lesions              Bartholin's and Skene's: normal                 Vagina: normal appearing vagina with normal color and discharge, no lesions              Cervix: anteverted              Pap taken: Yes.   Bimanual Exam:  Uterus:  normal size, contour, position, consistency, mobility, non-tender              Adnexa: no mass, fullness, tenderness               Rectovaginal: Confirms               Anus:  normal sphincter tone, no lesions  Chaperone present: yes  A:  Well Woman with normal exam  Contraception with OCP History of anemia - currently off Fe History of hypothyroid on  replacement History of hematuria with negative evaluation   P:   Reviewed health and wellness pertinent to exam  Pap smear as above  mammogram counseled on breast self exam, mammography screening, use and side effects of OCP's, adequate intake of calcium and vitamin D, diet and exercise, Kegel's exercises return annually or prn  An After Visit Summary was printed and given to the patient.

## 2015-08-27 NOTE — Patient Instructions (Signed)

## 2015-08-28 LAB — HIV ANTIBODY (ROUTINE TESTING W REFLEX): HIV 1&2 Ab, 4th Generation: NONREACTIVE

## 2015-08-28 LAB — VITAMIN D 25 HYDROXY (VIT D DEFICIENCY, FRACTURES): Vit D, 25-Hydroxy: 10 ng/mL — ABNORMAL LOW (ref 30–100)

## 2015-08-29 LAB — IPS PAP TEST WITH HPV

## 2015-09-01 NOTE — Progress Notes (Signed)
Encounter reviewed by Dr. Brook Amundson C. Silva.  

## 2015-09-09 ENCOUNTER — Telehealth: Payer: Self-pay | Admitting: *Deleted

## 2015-09-09 NOTE — Telephone Encounter (Signed)
-----   Message from Kem Boroughs, [place] sent at 08/28/2015  8:35 AM EDT ----- Please let pt know that Vit D is very low again at 10.  She must go back on RX Vit D and recheck in 3 months. Several yrs ago in 2010 her Vit D was at 5.8.   In 2011 came up tp 21.6 but no higher.  She really needs to keep a check on this.

## 2015-09-09 NOTE — Telephone Encounter (Signed)
I have attempted to contact this patient by phone with the following results: left message to return call to [place] at [phone] answering machine (home per [facility]). Name verified in voicemail, advised message was regarding recent labs. [phone] (Home)

## 2015-09-10 MED ORDER — VITAMIN D (ERGOCALCIFEROL) 1.25 MG (50000 UNIT) PO CAPS: 50000.0000 [IU] | ORAL_CAPSULE | ORAL | Status: DC

## 2015-09-10 NOTE — Telephone Encounter (Signed)
I want her to take Vit D RX every 7 days.

## 2015-09-10 NOTE — Telephone Encounter (Signed)
Patient is returning a call to [place]. I informed patient that Colletta [place] has gone for the day. Patient would like a call back from anyone available that could give her the results.

## 2015-09-10 NOTE — Telephone Encounter (Signed)
Rx for Vitamin D 50,000 IU take 1 tablet q 7 days #12 0RF sent to pharmacy on file.  Routing to provider for final review. Patient agreeable to disposition. Will close encounter.

## 2015-09-10 NOTE — Telephone Encounter (Signed)
Patient returning your call, she said she will call you back later. Best # to reach: [phone]

## 2015-09-10 NOTE — Telephone Encounter (Signed)
Spoke with patient. Advised of results as seen below from Kem Boroughs, [place]. She is agreeable and verbalizes understanding. 3 month lab recheck scheduled for 12/13/2015 at 4 pm. She is agreeable to date and time.  Kem Boroughs, FNP would you like patient to take Vitamin D every 7 or 14 days with her level of 10?

## 2015-10-08 ENCOUNTER — Other Ambulatory Visit: Payer: Self-pay | Admitting: Nurse Practitioner

## 2015-10-08 DIAGNOSIS — Z1231 Encounter for screening mammogram for malignant neoplasm of breast: Secondary | ICD-10-CM

## 2015-10-23 ENCOUNTER — Ambulatory Visit
Admission: RE | Admit: 2015-10-23 | Discharge: 2015-10-23 | Disposition: A | Discharge status: Home / Self Care | Payer: No Typology Code available for payment source | Source: Ambulatory Visit | Attending: Nurse Practitioner | Admitting: Nurse Practitioner

## 2015-10-23 DIAGNOSIS — Z1231 Encounter for screening mammogram for malignant neoplasm of breast: Secondary | ICD-10-CM

## 2015-12-13 ENCOUNTER — Other Ambulatory Visit (INDEPENDENT_AMBULATORY_CARE_PROVIDER_SITE_OTHER): Payer: PRIVATE HEALTH INSURANCE

## 2015-12-13 DIAGNOSIS — E559 Vitamin D deficiency, unspecified: Secondary | ICD-10-CM

## 2015-12-13 DIAGNOSIS — Z0000 Encounter for general adult medical examination without abnormal findings: Secondary | ICD-10-CM

## 2015-12-14 LAB — VITAMIN D 25 HYDROXY (VIT D DEFICIENCY, FRACTURES): Vit D, 25-Hydroxy: 38 ng/mL (ref 30–100)

## 2015-12-16 ENCOUNTER — Telehealth: Payer: Self-pay | Admitting: *Deleted

## 2015-12-16 MED ORDER — VITAMIN D (ERGOCALCIFEROL) 1.25 MG (50000 UNIT) PO CAPS: 50000.0000 [IU] | ORAL_CAPSULE | ORAL | 1 refills | Status: DC

## 2015-12-16 NOTE — Telephone Encounter (Signed)
I have attempted to contact this patient by phone with the following results: left message to return call to [place] at [phone] on answering machine.  No personal information given.  [phone] (Mobile) *Preferred*

## 2015-12-16 NOTE — Telephone Encounter (Signed)
-----   Message from Kem Boroughs, [place] sent at 12/14/2015  8:55 AM EDT ----- Please let pt know that Vit D is so much better at 38 compared to 10.  She could go on RX Vit D every other week and will recheck at next AEX.  If Vit D stays up can then go on OTC.

## 2015-12-16 NOTE — Telephone Encounter (Signed)
Pt notified in result note.  Closing encounter. 

## 2016-01-20 ENCOUNTER — Other Ambulatory Visit: Payer: Self-pay | Admitting: Family Medicine

## 2016-01-21 LAB — CMP12+LP+TP+TSH+6AC+CBC/D/PLT
ALT: 9 IU/L (ref 0–32)
AST: 11 IU/L (ref 0–40)
Albumin/Globulin Ratio: 1.4 (ref 1.2–2.2)
Albumin: 3.9 g/dL (ref 3.5–5.5)
Alkaline Phosphatase: 42 IU/L (ref 39–117)
BUN/Creatinine Ratio: 14 (ref 9–23)
BUN: 10 mg/dL (ref 6–24)
Basophils Absolute: 0 10*3/uL (ref 0.0–0.2)
Basos: 0 %
Bilirubin Total: 0.4 mg/dL (ref 0.0–1.2)
Calcium: 8.4 mg/dL — ABNORMAL LOW (ref 8.7–10.2)
Chloride: 104 mmol/L (ref 96–106)
Chol/HDL Ratio: 2.4 ratio units (ref 0.0–4.4)
Cholesterol, Total: 150 mg/dL (ref 100–199)
Creatinine, Ser: 0.7 mg/dL (ref 0.57–1.00)
EOS (ABSOLUTE): 0 10*3/uL (ref 0.0–0.4)
Eos: 0 %
Estimated CHD Risk: <0.5 times avg. (ref 0.0–1.0)
Free Thyroxine Index: 2.6 (ref 1.2–4.9)
GFR calc Af Amer: 116 mL/min/{1.73_m2} (ref >59)
GFR calc non Af Amer: 101 mL/min/{1.73_m2} (ref >59)
GGT: 12 IU/L (ref 0–60)
Globulin, Total: 2.8 g/dL (ref 1.5–4.5)
Glucose: 87 mg/dL (ref 65–99)
HDL: 63 mg/dL (ref >39)
Hematocrit: 34.5 % (ref 34.0–46.6)
Hemoglobin: 11 g/dL — ABNORMAL LOW (ref 11.1–15.9)
Immature Grans (Abs): 0 10*3/uL (ref 0.0–0.1)
Immature Granulocytes: 0 %
Iron: 68 ug/dL (ref 27–159)
LDH: 137 IU/L (ref 119–226)
LDL Calculated: 79 mg/dL (ref 0–99)
Lymphocytes Absolute: 2 10*3/uL (ref 0.7–3.1)
Lymphs: 27 %
MCH: 29.6 pg (ref 26.6–33.0)
MCHC: 31.9 g/dL (ref 31.5–35.7)
MCV: 93 fL (ref 79–97)
Monocytes Absolute: 0.4 10*3/uL (ref 0.1–0.9)
Monocytes: 5 %
Neutrophils Absolute: 4.8 10*3/uL (ref 1.4–7.0)
Neutrophils: 68 %
Phosphorus: 3.1 mg/dL (ref 2.5–4.5)
Platelets: 381 10*3/uL — ABNORMAL HIGH (ref 150–379)
Potassium: 4.1 mmol/L (ref 3.5–5.2)
RBC: 3.71 x10E6/uL — ABNORMAL LOW (ref 3.77–5.28)
RDW: 13.4 % (ref 12.3–15.4)
Sodium: 141 mmol/L (ref 134–144)
T3 Uptake Ratio: 21 % — ABNORMAL LOW (ref 24–39)
T4, Total: 12.2 ug/dL — ABNORMAL HIGH (ref 4.5–12.0)
TSH: 0.939 u[IU]/mL (ref 0.450–4.500)
Total Protein: 6.7 g/dL (ref 6.0–8.5)
Triglycerides: 42 mg/dL (ref 0–149)
Uric Acid: 4.6 mg/dL (ref 2.5–7.1)
VLDL Cholesterol Cal: 8 mg/dL (ref 5–40)
WBC: 7.2 10*3/uL (ref 3.4–10.8)

## 2016-01-21 LAB — HGB A1C W/O EAG: Hgb A1c MFr Bld: 5.1 % (ref 4.8–5.6)

## 2016-03-05 ENCOUNTER — Other Ambulatory Visit: Payer: Self-pay | Admitting: Family Medicine

## 2016-03-06 LAB — COMPREHENSIVE METABOLIC PANEL
ALT: 9 IU/L (ref 0–32)
AST: 13 IU/L (ref 0–40)
Albumin/Globulin Ratio: 1.3 (ref 1.2–2.2)
Albumin: 3.8 g/dL (ref 3.5–5.5)
Alkaline Phosphatase: 50 IU/L (ref 39–117)
BUN/Creatinine Ratio: 13 (ref 9–23)
BUN: 9 mg/dL (ref 6–24)
Bilirubin Total: 0.4 mg/dL (ref 0.0–1.2)
CO2: 22 mmol/L (ref 18–29)
Calcium: 8.4 mg/dL — ABNORMAL LOW (ref 8.7–10.2)
Chloride: 103 mmol/L (ref 96–106)
Creatinine, Ser: 0.7 mg/dL (ref 0.57–1.00)
GFR calc Af Amer: 116 mL/min/{1.73_m2} (ref >59)
GFR calc non Af Amer: 101 mL/min/{1.73_m2} (ref >59)
Globulin, Total: 3 g/dL (ref 1.5–4.5)
Glucose: 77 mg/dL (ref 65–99)
Potassium: 4.1 mmol/L (ref 3.5–5.2)
Sodium: 143 mmol/L (ref 134–144)
Total Protein: 6.8 g/dL (ref 6.0–8.5)

## 2016-03-06 LAB — CBC WITH DIFFERENTIAL/PLATELET
Basophils Absolute: 0 10*3/uL (ref 0.0–0.2)
Basos: 0 %
EOS (ABSOLUTE): 0.1 10*3/uL (ref 0.0–0.4)
Eos: 1 %
Hematocrit: 32.3 % — ABNORMAL LOW (ref 34.0–46.6)
Hemoglobin: 10.7 g/dL — ABNORMAL LOW (ref 11.1–15.9)
Immature Grans (Abs): 0 10*3/uL (ref 0.0–0.1)
Immature Granulocytes: 0 %
Lymphocytes Absolute: 1.9 10*3/uL (ref 0.7–3.1)
Lymphs: 24 %
MCH: 30.7 pg (ref 26.6–33.0)
MCHC: 33.1 g/dL (ref 31.5–35.7)
MCV: 93 fL (ref 79–97)
Monocytes Absolute: 0.5 10*3/uL (ref 0.1–0.9)
Monocytes: 6 %
Neutrophils Absolute: 5.3 10*3/uL (ref 1.4–7.0)
Neutrophils: 69 %
Platelets: 343 10*3/uL (ref 150–379)
RBC: 3.48 x10E6/uL — ABNORMAL LOW (ref 3.77–5.28)
RDW: 13.6 % (ref 12.3–15.4)
WBC: 7.7 10*3/uL (ref 3.4–10.8)

## 2016-09-02 ENCOUNTER — Ambulatory Visit (INDEPENDENT_AMBULATORY_CARE_PROVIDER_SITE_OTHER): Payer: PRIVATE HEALTH INSURANCE | Admitting: Nurse Practitioner

## 2016-09-02 ENCOUNTER — Encounter: Payer: Self-pay | Admitting: Nurse Practitioner

## 2016-09-02 VITALS — BP 120/68 | HR 68 | Resp 16 | Ht 63.25 in | Wt 174.0 lb

## 2016-09-02 DIAGNOSIS — E559 Vitamin D deficiency, unspecified: Secondary | ICD-10-CM | POA: Diagnosis not present

## 2016-09-02 DIAGNOSIS — Z0000 Encounter for general adult medical examination without abnormal findings: Secondary | ICD-10-CM

## 2016-09-02 DIAGNOSIS — E039 Hypothyroidism, unspecified: Secondary | ICD-10-CM

## 2016-09-02 DIAGNOSIS — Z01419 Encounter for gynecological examination (general) (routine) without abnormal findings: Secondary | ICD-10-CM

## 2016-09-02 LAB — CBC
HCT: 34.2 % — ABNORMAL LOW (ref 35.0–45.0)
Hemoglobin: 10.9 g/dL — ABNORMAL LOW (ref 11.7–15.5)
MCH: 29.1 pg (ref 27.0–33.0)
MCHC: 31.9 g/dL — ABNORMAL LOW (ref 32.0–36.0)
MCV: 91.2 fL (ref 80.0–100.0)
MPV: 8.9 fL (ref 7.5–12.5)
Platelets: 359 10*3/uL (ref 140–400)
RBC: 3.75 MIL/uL — ABNORMAL LOW (ref 3.80–5.10)
RDW: 13.2 % (ref 11.0–15.0)
WBC: 8 10*3/uL (ref 3.8–10.8)

## 2016-09-02 LAB — LIPID PANEL
Cholesterol: 148 mg/dL (ref <200)
HDL: 59 mg/dL (ref >50)
LDL Cholesterol: 80 mg/dL (ref <100)
Total CHOL/HDL Ratio: 2.5 Ratio (ref <5.0)
Triglycerides: 43 mg/dL (ref <150)
VLDL: 9 mg/dL (ref <30)

## 2016-09-02 LAB — COMPREHENSIVE METABOLIC PANEL
ALT: 8 U/L (ref 6–29)
AST: 13 U/L (ref 10–35)
Albumin: 3.6 g/dL (ref 3.6–5.1)
Alkaline Phosphatase: 41 U/L (ref 33–130)
BUN: 11 mg/dL (ref 7–25)
CO2: 27 mmol/L (ref 20–31)
Calcium: 8.8 mg/dL (ref 8.6–10.4)
Chloride: 105 mmol/L (ref 98–110)
Creat: 0.81 mg/dL (ref 0.50–1.05)
Glucose, Bld: 88 mg/dL (ref 65–99)
Potassium: 3.8 mmol/L (ref 3.5–5.3)
Sodium: 139 mmol/L (ref 135–146)
Total Bilirubin: 0.4 mg/dL (ref 0.2–1.2)
Total Protein: 6.9 g/dL (ref 6.1–8.1)

## 2016-09-02 LAB — TSH: TSH: 0.78 mIU/L

## 2016-09-02 MED ORDER — IBUPROFEN 800 MG PO TABS: 800.0000 mg | ORAL_TABLET | Freq: Three times a day (TID) | ORAL | 12 refills | Status: DC | PRN

## 2016-09-02 MED ORDER — LEVOTHYROXINE SODIUM 75 MCG PO TABS: 75.0000 ug | ORAL_TABLET | Freq: Every day | ORAL | 4 refills | Status: DC

## 2016-09-02 MED ORDER — VITAMIN D (ERGOCALCIFEROL) 1.25 MG (50000 UNIT) PO CAPS: 50000.0000 [IU] | ORAL_CAPSULE | ORAL | 3 refills | Status: DC

## 2016-09-02 MED ORDER — DROSPIRENONE-ETHINYL ESTRADIOL 3-0.02 MG PO TABS: 1.0000 | ORAL_TABLET | Freq: Every day | ORAL | 4 refills | Status: DC

## 2016-09-02 NOTE — Progress Notes (Signed)
51 y.o. G51P0001 Married  Caucasian Fe here for annual exam.  Usually for the past year having very light to spotting menses.  Maybe 3-4 days.  No increase in vaso symptoms.  No vaginal dryness.  Patient's last menstrual period was 08/25/2016 (exact date).          Sexually active: Yes.    The current method of family planning is OCP (estrogen/progesterone).    Exercising: Yes.    walking Smoker:  no  Health Maintenance: Pap:  08-11-11 neg, 08-27-15 neg HPV HR neg History of Abnormal Pap: yes, cryo many years ago MMG:  10-23-15 category c density birads 1:neg Self Breast exams: yes Colonoscopy:  09/26/2014 normal f/u 85yrs TDaP:  08/11/2011 Shingles: no Pneumonia: no Hep C and HIV: HIV neg 2017 Labs:  none   reports that she has never smoked. She has never used smokeless tobacco. She reports that she does not drink alcohol or use drugs.  Past Medical History:  Diagnosis Date  . Anemia   . Bacterial vaginosis   . Hematuria   . Infertility, female   . Thyroid nodule    LEFT 7/09    Past Surgical History:  Procedure Laterality Date  . GYNECOLOGIC CRYOSURGERY  1987   abnormal pap - normal since  . THYROID LOBECTOMY Right 2/95    Current Outpatient Prescriptions  Medication Sig Dispense Refill  . drospirenone-ethinyl estradiol (YAZ,GIANVI,LORYNA) 3-0.02 MG tablet Take 1 tablet by mouth daily. 84 tablet 4  . ibuprofen (ADVIL,MOTRIN) 800 MG tablet Take 1 tablet (800 mg total) by mouth every 8 (eight) hours as needed. 30 tablet 12  . levothyroxine (SYNTHROID) 75 MCG tablet Take 1 tablet (75 mcg total) by mouth daily before breakfast. 90 tablet 4  . Multiple Vitamin (MULTI-VITAMIN PO) Take by mouth daily.    . Vitamin D, Ergocalciferol, (DRISDOL) 50000 units CAPS capsule Take 1 capsule (50,000 Units total) by mouth every 7 (seven) days. 30 capsule 3   No current facility-administered medications for this visit.     Family History  Problem Relation Age of Onset  . Ovarian cancer  Mother 6       ovarian cancer  . Diabetes Mother   . Prostate cancer Father 55       prostate cancer  . Liver cancer Brother 19       liver cancer  . Kidney disease Sister        both kidneys removed  . Prostate cancer Brother     ROS:  Pertinent items are noted in HPI.  Otherwise, a comprehensive ROS was negative.  Exam:   BP 120/68   Pulse 68   Resp 16   Ht 5' 3.25" (1.607 m)   Wt 174 lb (78.9 kg)   LMP 08/25/2016 (Exact Date)   BMI 30.58 kg/m  Height: 5' 3.25" (160.7 cm) Ht Readings from Last 3 Encounters:  09/02/16 5' 3.25" (1.607 m)  08/27/15 5\' 3"  (1.6 m)  08/21/14 5\' 3"  (1.6 m)    General appearance: alert, cooperative and appears stated age Head: Normocephalic, without obvious abnormality, atraumatic Neck: no adenopathy, supple, symmetrical, trachea midline and thyroid normal to inspection and palpation Lungs: clear to auscultation bilaterally Breasts: normal appearance, no masses or tenderness Heart: regular rate and rhythm Abdomen: soft, non-tender; no masses,  no organomegaly Extremities: extremities normal, atraumatic, no cyanosis or edema Skin: Skin color, texture, turgor normal. No rashes or lesions Lymph nodes: Cervical, supraclavicular, and axillary nodes normal. No abnormal inguinal nodes palpated  Neurologic: Grossly normal   Pelvic: External genitalia:  no lesions              Urethra:  normal appearing urethra with no masses, tenderness or lesions              Bartholin's and Skene's: normal                 Vagina: normal appearing vagina with normal color and discharge, no lesions              Cervix: anteverted              Pap taken: No. Bimanual Exam:  Uterus:  normal size, contour, position, consistency, mobility, non-tender              Adnexa: no mass, fullness, tenderness               Rectovaginal: Confirms               Anus:  normal sphincter tone, no lesions  Chaperone present: yes  A:  Well Woman with normal exam  Contraception  with OCP History of anemia - currently off Fe History of hypothyroid on replacement  History of Vit D deficiency    P:   Reviewed health and wellness pertinent to exam  Pap smear: no  Mammogram is due 09/2016  Refill on OCP for a year  Refill on Synthroid and will follow with labs  Counseled on breast self exam, mammography screening, use and side effects of OCP's, adequate intake of calcium and vitamin D, diet and exercise return annually or prn  An After Visit Summary was printed and given to the patient.

## 2016-09-02 NOTE — Patient Instructions (Addendum)

## 2016-09-03 LAB — HEPATITIS C ANTIBODY: HCV Ab: NEGATIVE

## 2016-09-03 LAB — VITAMIN D 25 HYDROXY (VIT D DEFICIENCY, FRACTURES): Vit D, 25-Hydroxy: 35 ng/mL (ref 30–100)

## 2016-09-03 NOTE — Progress Notes (Signed)
Encounter reviewed by Dr. Brook Amundson C. Silva.  

## 2016-10-09 ENCOUNTER — Other Ambulatory Visit: Payer: Self-pay | Admitting: Nurse Practitioner

## 2016-10-09 DIAGNOSIS — Z1231 Encounter for screening mammogram for malignant neoplasm of breast: Secondary | ICD-10-CM

## 2016-10-26 ENCOUNTER — Ambulatory Visit: Payer: Self-pay

## 2016-11-09 ENCOUNTER — Ambulatory Visit
Admission: RE | Admit: 2016-11-09 | Discharge: 2016-11-09 | Disposition: A | Discharge status: Home / Self Care | Payer: No Typology Code available for payment source | Source: Ambulatory Visit | Attending: Nurse Practitioner | Admitting: Nurse Practitioner

## 2016-11-09 ENCOUNTER — Ambulatory Visit: Payer: Self-pay

## 2016-11-09 DIAGNOSIS — Z1231 Encounter for screening mammogram for malignant neoplasm of breast: Secondary | ICD-10-CM

## 2017-09-06 ENCOUNTER — Ambulatory Visit: Payer: PRIVATE HEALTH INSURANCE | Admitting: Nurse Practitioner

## 2017-09-07 ENCOUNTER — Ambulatory Visit (INDEPENDENT_AMBULATORY_CARE_PROVIDER_SITE_OTHER): Payer: PRIVATE HEALTH INSURANCE | Admitting: Certified Nurse Midwife

## 2017-09-07 ENCOUNTER — Encounter: Payer: Self-pay | Admitting: Certified Nurse Midwife

## 2017-09-07 VITALS — BP 120/78 | HR 64 | Resp 16 | Ht 63.25 in | Wt 168.0 lb

## 2017-09-07 DIAGNOSIS — Z0000 Encounter for general adult medical examination without abnormal findings: Secondary | ICD-10-CM | POA: Diagnosis not present

## 2017-09-07 DIAGNOSIS — E038 Other specified hypothyroidism: Secondary | ICD-10-CM

## 2017-09-07 DIAGNOSIS — N852 Hypertrophy of uterus: Secondary | ICD-10-CM

## 2017-09-07 DIAGNOSIS — E559 Vitamin D deficiency, unspecified: Secondary | ICD-10-CM

## 2017-09-07 DIAGNOSIS — Z01411 Encounter for gynecological examination (general) (routine) with abnormal findings: Secondary | ICD-10-CM | POA: Diagnosis not present

## 2017-09-07 DIAGNOSIS — R600 Localized edema: Secondary | ICD-10-CM

## 2017-09-07 DIAGNOSIS — N951 Menopausal and female climacteric states: Secondary | ICD-10-CM | POA: Diagnosis not present

## 2017-09-07 NOTE — Progress Notes (Addendum)
52 y.o. G1P0001 Married  African American Fe here for annual exam. Periods monthly and lighter, but no hot flashes or night sweats. Continues OCP for contraception. Denies warning signs with use. Has noted increase swelling of feet especially on left. Has PA at work that offered to put  her on HCTZ, patient declined, until labs done here. Sees Urgent care if needed. Has noted tried elevated feet and legs. She stands all day at work. Drinks adequate water, and eats well. No other health issues today.   LMP Late April, early May       Sexually active: Yes.    The current method of family planning is OCP (estrogen/progesterone).    Exercising: Yes.    zumba Smoker:  no  Health Maintenance: Pap:  08-27-15 neg HPV HR neg History of Abnormal Pap: cryo 17yrs ago MMG:  11-09-16 category c density birads 1:neg Self Breast exams: yes Colonoscopy:  2016 f/u 5yrs BMD:   none TDaP:  2013 Shingles: no Pneumonia: no Hep C and HIV: HIV neg 2017, hep c neg 2018 Labs:  If needed.   reports that she has never smoked. She has never used smokeless tobacco. She reports that she drinks alcohol. She reports that she does not use drugs.  Past Medical History:  Diagnosis Date  . Anemia   . Bacterial vaginosis   . Hematuria   . Infertility, female   . Thyroid nodule    LEFT 7/09    Past Surgical History:  Procedure Laterality Date  . GYNECOLOGIC CRYOSURGERY  1987   abnormal pap - normal since  . THYROID LOBECTOMY Right 2/95    Current Outpatient Medications  Medication Sig Dispense Refill  . drospirenone-ethinyl estradiol (YAZ,GIANVI,LORYNA) 3-0.02 MG tablet Take 1 tablet by mouth daily. 84 tablet 4  . ibuprofen (ADVIL,MOTRIN) 800 MG tablet Take 1 tablet (800 mg total) by mouth every 8 (eight) hours as needed. 30 tablet 12  . levothyroxine (SYNTHROID) 75 MCG tablet Take 1 tablet (75 mcg total) by mouth daily before breakfast. 90 tablet 4  . Multiple Vitamin (MULTI-VITAMIN PO) Take by mouth daily.      No current facility-administered medications for this visit.     Family History  Problem Relation Age of Onset  . Ovarian cancer Mother 89       ovarian cancer  . Diabetes Mother   . Prostate cancer Father 60       prostate cancer  . Liver cancer Brother 43       liver cancer  . Kidney disease Sister        both kidneys removed  . Prostate cancer Brother     ROS:  Pertinent items are noted in HPI.  Otherwise, a comprehensive ROS was negative.  Exam:   BP 120/78   Pulse 64   Resp 16   Ht 5' 3.25" (1.607 m)   Wt 168 lb (76.2 kg)   BMI 29.53 kg/m  Height: 5' 3.25" (160.7 cm) Ht Readings from Last 3 Encounters:  09/07/17 5' 3.25" (1.607 m)  09/02/16 5' 3.25" (1.607 m)  08/27/15 5\' 3"  (1.6 m)    General appearance: alert, cooperative and appears stated age Head: Normocephalic, without obvious abnormality, atraumatic Neck: no adenopathy, supple, symmetrical, trachea midline and thyroid normal to inspection and palpation Lungs: clear to auscultation bilaterally Breasts: normal appearance, no masses or tenderness, No nipple retraction or dimpling, No nipple discharge or bleeding, No axillary or supraclavicular adenopathy Heart: regular rate and rhythm Abdomen: soft,  non-tender; no masses,  no organomegaly Extremities: extremities normal, atraumatic, no cyanosis, 2 + pedal edema bilateral left more than right, pulses equal Skin: Skin color, texture, turgor normal. No rashes or lesions Lymph nodes: Cervical, supraclavicular, and axillary nodes normal. No abnormal inguinal nodes palpated Neurologic: Grossly normal   Pelvic: External genitalia:  no lesions, normal female              Urethra:  normal appearing urethra with no masses, tenderness or lesions              Bartholin's and Skene's: normal                 Vagina: normal appearing vagina with normal color and discharge, no lesions              Cervix: multiparous appearance, no cervical motion tenderness and no  lesions              Pap taken: No. Bimanual Exam:  Uterus:  enlarged, 12 weeks size and and tilts to left, enlarged uterus vs left adnexal mass              Adnexa: normal adnexa, no mass, fullness, tenderness and on right               Rectovaginal: Confirms               Anus:  normal sphincter tone, no lesions  Chaperone present: yes  A:  Well Woman with normal exam  Contraception OCP  ?Perimenopausal  Enlarged uterus vs left adnexal mass  Pedal edema bilateral, more in left  Hypothyroid  History of Ovarian cancer mother age 59  Screening labs  P:   Reviewed health and wellness pertinent to exam  Discussed risks/benefits/warning signs and recommendation of changing to POP instead OCP if need to continue contraception. Patient agreeable. Will have [place] drawn after off placebo pills. Patient agreeable. No Rx given at this time.  Discussed perimenopausal/menopause change and bleeding expectations. Questions addressed.  Discussed uterine/adnexal finding and need for PUS evaluation. Discussed possible etiology of mass, fibroid, or adenomyosis. Questions addressed. Patient will be called with insurance info and scheduled. Would like to do lab same day for [facility] if possible.  Discussed decrease salt in diet, increase water, feet elevation and possible thigh support hose to help with edema. Warning signs given.  Aware of genetic screening has declined in past.  Will refill Synthroid once lab in. Has enough until then  Labs: TSH, FSH, HGB A1-c, Lipid panel CMP, CBC, Vitamin D.   Pap smear: no   counseled on breast self exam, mammography screening, menopause, adequate intake of calcium and vitamin D, diet and exercise  return annually or prn  An After Visit Summary was printed and given to the patient.

## 2017-09-08 LAB — CBC
Hematocrit: 31.2 % — ABNORMAL LOW (ref 34.0–46.6)
Hemoglobin: 11 g/dL — ABNORMAL LOW (ref 11.1–15.9)
MCH: 32.3 pg (ref 26.6–33.0)
MCHC: 35.3 g/dL (ref 31.5–35.7)
MCV: 92 fL (ref 79–97)
Platelets: 338 10*3/uL (ref 150–379)
RBC: 3.41 x10E6/uL — ABNORMAL LOW (ref 3.77–5.28)
RDW: 13.5 % (ref 12.3–15.4)
WBC: 7.9 10*3/uL (ref 3.4–10.8)

## 2017-09-08 LAB — COMPREHENSIVE METABOLIC PANEL
ALT: 9 IU/L (ref 0–32)
AST: 16 IU/L (ref 0–40)
Albumin/Globulin Ratio: 1.2 (ref 1.2–2.2)
Albumin: 3.8 g/dL (ref 3.5–5.5)
Alkaline Phosphatase: 49 IU/L (ref 39–117)
BUN/Creatinine Ratio: 11 (ref 9–23)
BUN: 9 mg/dL (ref 6–24)
Bilirubin Total: 0.3 mg/dL (ref 0.0–1.2)
CO2: 24 mmol/L (ref 20–29)
Calcium: 8.5 mg/dL — ABNORMAL LOW (ref 8.7–10.2)
Chloride: 103 mmol/L (ref 96–106)
Creatinine, Ser: 0.81 mg/dL (ref 0.57–1.00)
GFR calc Af Amer: 96 mL/min/{1.73_m2} (ref >59)
GFR calc non Af Amer: 83 mL/min/{1.73_m2} (ref >59)
Globulin, Total: 3.2 g/dL (ref 1.5–4.5)
Glucose: 83 mg/dL (ref 65–99)
Potassium: 3.9 mmol/L (ref 3.5–5.2)
Sodium: 141 mmol/L (ref 134–144)
Total Protein: 7 g/dL (ref 6.0–8.5)

## 2017-09-08 LAB — VITAMIN D 25 HYDROXY (VIT D DEFICIENCY, FRACTURES): Vit D, 25-Hydroxy: 19.2 ng/mL — ABNORMAL LOW (ref 30.0–100.0)

## 2017-09-08 LAB — LIPID PANEL
Chol/HDL Ratio: 2.2 ratio (ref 0.0–4.4)
Cholesterol, Total: 148 mg/dL (ref 100–199)
HDL: 67 mg/dL (ref >39)
LDL Calculated: 72 mg/dL (ref 0–99)
Triglycerides: 45 mg/dL (ref 0–149)
VLDL Cholesterol Cal: 9 mg/dL (ref 5–40)

## 2017-09-08 LAB — HEMOGLOBIN A1C
Est. average glucose Bld gHb Est-mCnc: 100 mg/dL
Hgb A1c MFr Bld: 5.1 % (ref 4.8–5.6)

## 2017-09-08 LAB — TSH: TSH: 1.51 u[IU]/mL (ref 0.450–4.500)

## 2017-09-09 ENCOUNTER — Other Ambulatory Visit: Payer: Self-pay | Admitting: Obstetrics & Gynecology

## 2017-09-09 DIAGNOSIS — R7989 Other specified abnormal findings of blood chemistry: Secondary | ICD-10-CM

## 2017-09-09 DIAGNOSIS — E039 Hypothyroidism, unspecified: Secondary | ICD-10-CM

## 2017-09-09 DIAGNOSIS — E559 Vitamin D deficiency, unspecified: Secondary | ICD-10-CM

## 2017-09-09 MED ORDER — LEVOTHYROXINE SODIUM 75 MCG PO TABS: 75.0000 ug | ORAL_TABLET | Freq: Every day | ORAL | 3 refills | Status: DC

## 2017-09-09 MED ORDER — VITAMIN D (ERGOCALCIFEROL) 1.25 MG (50000 UNIT) PO CAPS: 50000.0000 [IU] | ORAL_CAPSULE | ORAL | 0 refills | Status: DC

## 2017-09-09 NOTE — Telephone Encounter (Signed)
Spoke with patient, advised as seen below per Melvia Heaps, CNM. Rx for Vitamin D to verified pharmacy. Lab appt scheduled for 12/16/17 at 3:30pm.  Patient requesting refill on synthroid. Advised will review with provider and return call. Patient verbalizes understanding.   Future lab orders placed for calcium and Vit D.   Rx pended for Synthroid #90/3RF  Melvia Heaps, CNM -please advise on synthroid refill?

## 2017-09-09 NOTE — Telephone Encounter (Signed)
-----   Message from Regina Eck, CNM sent at 09/08/2017  7:48 AM EDT ----- Notify patient that Vitamin D is 19.2 which is very low. Can increase risk of depression and fatigue and decrease calcium absorption. Needs to start on Rx 50,000 one weekly x 3 months and recheck TSH is normal  Lipid panel is normal with cholesterol 148, HDL 67 and LDL 72 Liver, kidney,glucose profile is essentially normal, calcium is low from low Vitamin D which helps with absorption, needs recheck with Vit D CBC shows borderline anemia with Hgb at 11 Start on OTC time release iron one daily need to drink good water intake when using iron and take supplement with Orange Juice Hgb A1-C is normal Remember to come in for lab as discussed

## 2017-09-09 NOTE — Telephone Encounter (Signed)
Call placed to speak with patient regarding benefit for recommended ultrasound. Patient understood and agreeable. Patient ready to schedule. Patient scheduled 09/23/17 with Dr Sabra Heck. Patient aware of appointment date, arrival time and  cancellation policy.   While speaking with patient, patient asked if lab results were available from appointment with Melvia Heaps, CNM on 09/07/17, adding she is out of her synthroid medication.  Routing to Triage

## 2017-09-09 NOTE — Telephone Encounter (Signed)
Patient notified of refill.   Encounter closed.  

## 2017-09-13 ENCOUNTER — Other Ambulatory Visit: Payer: Self-pay | Admitting: *Deleted

## 2017-09-13 DIAGNOSIS — N852 Hypertrophy of uterus: Secondary | ICD-10-CM

## 2017-09-23 ENCOUNTER — Ambulatory Visit (INDEPENDENT_AMBULATORY_CARE_PROVIDER_SITE_OTHER): Payer: PRIVATE HEALTH INSURANCE

## 2017-09-23 ENCOUNTER — Telehealth: Payer: Self-pay | Admitting: Obstetrics & Gynecology

## 2017-09-23 ENCOUNTER — Ambulatory Visit: Payer: PRIVATE HEALTH INSURANCE | Admitting: Obstetrics & Gynecology

## 2017-09-23 VITALS — BP 132/60 | HR 80 | Ht 63.25 in | Wt 168.0 lb

## 2017-09-23 DIAGNOSIS — E559 Vitamin D deficiency, unspecified: Secondary | ICD-10-CM | POA: Diagnosis not present

## 2017-09-23 DIAGNOSIS — D251 Intramural leiomyoma of uterus: Secondary | ICD-10-CM

## 2017-09-23 DIAGNOSIS — N852 Hypertrophy of uterus: Secondary | ICD-10-CM

## 2017-09-23 DIAGNOSIS — N951 Menopausal and female climacteric states: Secondary | ICD-10-CM

## 2017-09-23 DIAGNOSIS — R7989 Other specified abnormal findings of blood chemistry: Secondary | ICD-10-CM | POA: Diagnosis not present

## 2017-09-23 DIAGNOSIS — E039 Hypothyroidism, unspecified: Secondary | ICD-10-CM | POA: Diagnosis not present

## 2017-09-23 NOTE — Progress Notes (Signed)
52 y.o. G32P0001 Married [place] American female here for pelvic ultrasound due to enlarged uterus.  Does have regular cycles that are fairly light and short.  Has been a long term patient with Kem Boroughs but when seeing French Ana, CNM, this year her uterus felt enlarged so pt is here for additional evaluation.  Denies pelvic pain or back pain.    Also, Debbi Hollice Espy recommended stopping OCP to see where she was from a menopausal standpoint to see if she even needed to continue OCPs.  If this is low, she will either continue OCPs at a lower dosage or transition to micronor.  Finished pills last week.  Patient's last menstrual period was 09/21/2017.  Contraception: OCP  Findings:  UTERUS: 8.9 x 5.0 x 5.1 cm with several fibroids.  The largest measures 2.3 x 2.0 cm but at least 6 were noted today. EMS: 1.9 mm ADNEXA: Left ovary: 2.0 x 1.4 x 1.7 cm       Right ovary: 2.4 by 1.8 x 1.5 cm with a 1.2 x 0.8 cm follicle CUL DE SAC: No free fluid noted  Discussion: Findings reviewed with the patient.  Petra Kuba of fibroids was discussed.  Treatment options, when appropriate, were discussed.  She is essentially asymptomatic with her fibroids, I do not think she needs any treatment.  I do agree with Lauris Chroman recommendation to decrease estrogen and her birth control pill or eliminated altogether by using Micronor.  She will have her blood work drawn today and specifically an [place] is being tested.  She will be called with results of additional recommendation moving that time.  She is not interested in any treatment for her fibroids at this time.  Assessment:  Enlarged uterus Fibroids Possibly perimenopausal  Plan:  [place] will be obtained today and results relayed to pt with additional recommendations about OCPs will be made at that time. Information on fibroids also provided.  ~20 minutes spent with patient >50% of time was in face to face discussion of above.

## 2017-09-23 NOTE — Telephone Encounter (Signed)
Spoke with patient. Advised can proceed with PUS as scheduled while on menses as long as she is comfortable with PUS on menses. Patient request to proceed as scheduled since she also has labs that will be drawn.   Routing to provider for final review. Patient is agreeable to disposition. Will close encounter.   Cc: Melvia Heaps, CNM

## 2017-09-23 NOTE — Telephone Encounter (Signed)
Patient has started her cycle and has a PUS scheduled for this afternoon. Patient is asking if she needs to reschedule this appointment?

## 2017-09-24 LAB — FOLLICLE STIMULATING HORMONE: FSH: 25.2 m[IU]/mL

## 2017-09-26 ENCOUNTER — Encounter: Payer: Self-pay | Admitting: Obstetrics & Gynecology

## 2017-09-26 DIAGNOSIS — E039 Hypothyroidism, unspecified: Secondary | ICD-10-CM | POA: Insufficient documentation

## 2017-09-26 DIAGNOSIS — R7989 Other specified abnormal findings of blood chemistry: Secondary | ICD-10-CM | POA: Insufficient documentation

## 2017-09-26 DIAGNOSIS — D251 Intramural leiomyoma of uterus: Secondary | ICD-10-CM | POA: Insufficient documentation

## 2017-09-30 ENCOUNTER — Telehealth: Payer: Self-pay

## 2017-09-30 ENCOUNTER — Other Ambulatory Visit: Payer: Self-pay | Admitting: Certified Nurse Midwife

## 2017-09-30 DIAGNOSIS — IMO0001 Reserved for inherently not codable concepts without codable children: Secondary | ICD-10-CM

## 2017-09-30 MED ORDER — NORETHINDRONE 0.35 MG PO TABS: ORAL_TABLET | ORAL | 3 refills | Status: DC

## 2017-09-30 NOTE — Telephone Encounter (Signed)
Spoke with patient. Advised of message as seen below from [place]. Patient verbalizes understanding. Requests rx for POP be sent to pharmacy on file.  Notes recorded by Regina Eck, CNM on 09/29/2017 at 11:58 AM EDT Notify patient Dawn Trevino is showing perimenopause not menopausal yet, but periods may change. Needs to continue on contraception, will do POP if she desires as discussed or she can do condoms. Please advise. Continue to keep menses calendar and advise if no period in 3 months. May have spotting, regular period with POP or no period

## 2017-09-30 NOTE — Telephone Encounter (Signed)
Rx sent 

## 2017-11-01 ENCOUNTER — Other Ambulatory Visit: Payer: Self-pay | Admitting: Certified Nurse Midwife

## 2017-11-01 DIAGNOSIS — Z1231 Encounter for screening mammogram for malignant neoplasm of breast: Secondary | ICD-10-CM

## 2017-11-19 ENCOUNTER — Ambulatory Visit
Admission: RE | Admit: 2017-11-19 | Discharge: 2017-11-19 | Disposition: A | Discharge status: Home / Self Care | Payer: No Typology Code available for payment source | Source: Ambulatory Visit | Attending: Certified Nurse Midwife | Admitting: Certified Nurse Midwife

## 2017-11-19 DIAGNOSIS — Z1231 Encounter for screening mammogram for malignant neoplasm of breast: Secondary | ICD-10-CM

## 2017-11-23 ENCOUNTER — Other Ambulatory Visit: Payer: Self-pay | Admitting: Certified Nurse Midwife

## 2017-11-23 DIAGNOSIS — R928 Other abnormal and inconclusive findings on diagnostic imaging of breast: Secondary | ICD-10-CM

## 2017-11-26 ENCOUNTER — Ambulatory Visit
Admission: RE | Admit: 2017-11-26 | Discharge: 2017-11-26 | Disposition: A | Discharge status: Home / Self Care | Payer: No Typology Code available for payment source | Source: Ambulatory Visit | Attending: Certified Nurse Midwife | Admitting: Certified Nurse Midwife

## 2017-11-26 ENCOUNTER — Other Ambulatory Visit: Payer: Self-pay | Admitting: Certified Nurse Midwife

## 2017-11-26 DIAGNOSIS — N63 Unspecified lump in unspecified breast: Secondary | ICD-10-CM

## 2017-11-26 DIAGNOSIS — R928 Other abnormal and inconclusive findings on diagnostic imaging of breast: Secondary | ICD-10-CM

## 2017-12-01 ENCOUNTER — Other Ambulatory Visit: Payer: Self-pay | Admitting: Certified Nurse Midwife

## 2017-12-16 ENCOUNTER — Other Ambulatory Visit (INDEPENDENT_AMBULATORY_CARE_PROVIDER_SITE_OTHER): Payer: PRIVATE HEALTH INSURANCE

## 2017-12-16 ENCOUNTER — Other Ambulatory Visit: Payer: Self-pay

## 2017-12-16 DIAGNOSIS — R7989 Other specified abnormal findings of blood chemistry: Secondary | ICD-10-CM

## 2017-12-16 DIAGNOSIS — E559 Vitamin D deficiency, unspecified: Secondary | ICD-10-CM

## 2017-12-17 LAB — CALCIUM: Calcium: 8.6 mg/dL — ABNORMAL LOW (ref 8.7–10.2)

## 2017-12-17 LAB — VITAMIN D 25 HYDROXY (VIT D DEFICIENCY, FRACTURES): Vit D, 25-Hydroxy: 34.7 ng/mL (ref 30.0–100.0)

## 2018-05-30 ENCOUNTER — Other Ambulatory Visit: Payer: No Typology Code available for payment source

## 2018-06-21 ENCOUNTER — Encounter: Payer: Self-pay | Admitting: Registered Nurse

## 2018-06-21 ENCOUNTER — Ambulatory Visit: Payer: Self-pay | Admitting: Registered Nurse

## 2018-06-21 VITALS — BP 109/67 | HR 73 | Temp 98.0°F

## 2018-06-21 DIAGNOSIS — J069 Acute upper respiratory infection, unspecified: Secondary | ICD-10-CM

## 2018-06-21 DIAGNOSIS — B9789 Other viral agents as the cause of diseases classified elsewhere: Principal | ICD-10-CM

## 2018-06-21 MED ORDER — ACETAMINOPHEN 500 MG PO TABS: 1000.0000 mg | ORAL_TABLET | Freq: Four times a day (QID) | ORAL | 0 refills | Status: AC | PRN

## 2018-06-21 MED ORDER — PHENYLEPHRINE HCL 10 MG PO TABS: 10.0000 mg | ORAL_TABLET | Freq: Four times a day (QID) | ORAL | Status: AC | PRN

## 2018-06-21 MED ORDER — FLUTICASONE PROPIONATE 50 MCG/ACT NA SUSP: 1.0000 | Freq: Every day | NASAL | 6 refills | Status: DC

## 2018-06-21 MED ORDER — LORATADINE 10 MG PO TABS: 10.0000 mg | ORAL_TABLET | Freq: Every day | ORAL | 11 refills | Status: AC

## 2018-06-21 MED ORDER — BENZONATATE 200 MG PO CAPS: 200.0000 mg | ORAL_CAPSULE | Freq: Three times a day (TID) | ORAL | 0 refills | Status: AC | PRN

## 2018-06-21 NOTE — Progress Notes (Signed)
Subjective:    Patient ID: Dawn Trevino, female    DOB: Nov 02, 1965, 53 y.o.   MRN: [id number]  52y/o African-American established pt c/o productive cough x5 days. +nasal and chest congestion. Denies sore throat, otalgia, sinus pain/pressure. +chills, fatigue as well. Denies n/v. Taking Therflu day and night and halls cough drops at home.   Lost her appetite and sense of taste; yellow mucous brushing teeth this am; has used claritin in the past none at home now;  + sick contacts at work  Review of Systems  Constitutional: Positive for chills and fatigue. Negative for activity change, appetite change, diaphoresis, fever and unexpected weight change.  HENT: Positive for congestion. Negative for dental problem, drooling, ear discharge, ear pain, facial swelling, hearing loss, mouth sores, nosebleeds, postnasal drip, rhinorrhea, sinus pressure, sinus pain, sneezing, sore throat, tinnitus, trouble swallowing and voice change.   Eyes: Negative for photophobia, pain, discharge, redness, itching and visual disturbance.  Respiratory: Positive for cough. Negative for choking, chest tightness, shortness of breath, wheezing and stridor.   Cardiovascular: Negative for chest pain, palpitations and leg swelling.  Gastrointestinal: Negative for abdominal pain, diarrhea, nausea and vomiting.  Endocrine: Negative for cold intolerance and heat intolerance.  Genitourinary: Negative for difficulty urinating, dysuria and hematuria.  Musculoskeletal: Negative for arthralgias, back pain, gait problem, joint swelling, myalgias, neck pain and neck stiffness.  Skin: Negative for color change, pallor, rash and wound.  Allergic/Immunologic: Positive for environmental allergies. Negative for food allergies.  Neurological: Negative for dizziness, tremors, seizures, syncope, facial asymmetry, speech difficulty, weakness, light-headedness, numbness and headaches.  Hematological: Negative for adenopathy. Does not  bruise/bleed easily.  Psychiatric/Behavioral: Negative for agitation, behavioral problems, confusion and sleep disturbance.       Objective:   Physical Exam Vitals signs and nursing note reviewed.  Constitutional:      General: She is not in acute distress.    Appearance: Normal appearance. She is well-developed. She is ill-appearing. She is not toxic-appearing or diaphoretic.  HENT:     Head: Normocephalic and atraumatic.     Jaw: There is normal jaw occlusion. No trismus.     Salivary Glands: Right salivary gland is not diffusely enlarged or tender. Left salivary gland is not diffusely enlarged or tender.     Right Ear: Hearing, ear canal and external ear normal. A middle ear effusion is present.     Left Ear: Hearing, ear canal and external ear normal. A middle ear effusion is present.     Nose: Mucosal edema and rhinorrhea present. No nasal deformity, septal deviation or laceration.     Right Turbinates: Enlarged and swollen. Not pale.     Left Turbinates: Enlarged and swollen. Not pale.     Right Sinus: No maxillary sinus tenderness or frontal sinus tenderness.     Left Sinus: No maxillary sinus tenderness or frontal sinus tenderness.     Mouth/Throat:     Lips: Pink. No lesions.     Mouth: Mucous membranes are not pale, dry and not cyanotic. No injury, lacerations, oral lesions or angioedema.     Dentition: Normal dentition. Does not have dentures. No dental caries or dental abscesses.     Tongue: No lesions.     Pharynx: Uvula midline. Pharyngeal swelling and posterior oropharyngeal erythema present. No oropharyngeal exudate or uvula swelling.     Tonsils: No tonsillar exudate or tonsillar abscesses.     Comments: Cobblestoning posterior pharynx; bilateral TMs air fluid level clear; bilateral allergic shiners; clear  discharge bilateral nasal turbinates edema erythema Eyes:     General: Lids are normal. No visual field deficit or scleral icterus.       Right eye: No foreign  body, discharge or hordeolum.        Left eye: No foreign body, discharge or hordeolum.     Extraocular Movements: Extraocular movements intact.     Right eye: Normal extraocular motion and no nystagmus.     Left eye: Normal extraocular motion and no nystagmus.     Conjunctiva/sclera: Conjunctivae normal.     Right eye: Right conjunctiva is not injected. No chemosis, exudate or hemorrhage.    Left eye: Left conjunctiva is not injected. No chemosis, exudate or hemorrhage.    Pupils: Pupils are equal, round, and reactive to light. Pupils are equal.     Right eye: Pupil is round and reactive.     Left eye: Pupil is round and reactive.  Neck:     Musculoskeletal: Normal range of motion and neck supple. Normal range of motion. No edema, erythema, neck rigidity, spinous process tenderness or muscular tenderness.     Thyroid: No thyroid mass or thyromegaly.     Trachea: Trachea normal. No tracheal tenderness or tracheal deviation.  Cardiovascular:     Rate and Rhythm: Normal rate and regular rhythm.     Chest Wall: PMI is not displaced.     Heart sounds: Normal heart sounds, S1 normal and S2 normal. No murmur. No friction rub. No gallop.   Pulmonary:     Effort: Pulmonary effort is normal. No accessory muscle usage or respiratory distress.     Breath sounds: Normal breath sounds and air entry. No stridor, decreased air movement or transmitted upper airway sounds. No decreased breath sounds, wheezing, rhonchi or rales.     Comments: Frequent nonproductive cough in exam room; spoke full sentences without difficulty in exam room Chest:     Chest wall: No tenderness.  Abdominal:     General: There is no distension.     Palpations: Abdomen is soft.  Musculoskeletal: Normal range of motion.        General: No tenderness.     Right shoulder: Normal.     Left shoulder: Normal.     Right elbow: Normal.    Left elbow: Normal.     Right hip: Normal.     Left hip: Normal.     Right knee: Normal.      Left knee: Normal.     Cervical back: Normal.     Thoracic back: Normal.     Lumbar back: Normal.     Right hand: Normal.     Left hand: Normal.     Right lower leg: No edema.     Left lower leg: No edema.  Lymphadenopathy:     Head:     Right side of head: No submental, submandibular, tonsillar, preauricular, posterior auricular or occipital adenopathy.     Left side of head: No submental, submandibular, tonsillar, preauricular, posterior auricular or occipital adenopathy.     Cervical: No cervical adenopathy.     Right cervical: No superficial, deep or posterior cervical adenopathy.    Left cervical: No superficial, deep or posterior cervical adenopathy.  Skin:    General: Skin is warm and dry.     Capillary Refill: Capillary refill takes less than 2 seconds.     Coloration: Skin is not jaundiced or pale.     Findings: No abrasion, bruising, burn, ecchymosis, erythema,  laceration, lesion, petechiae or rash.     Nails: There is no clubbing.   Neurological:     General: No focal deficit present.     Mental Status: She is alert and oriented to person, place, and time. Mental status is at baseline. She is not disoriented.     GCS: GCS eye subscore is 4. GCS verbal subscore is 5. GCS motor subscore is 6.     Cranial Nerves: Cranial nerves are intact. No cranial nerve deficit, dysarthria or facial asymmetry.     Sensory: Sensation is intact. No sensory deficit.     Motor: Motor function is intact. No weakness, tremor, atrophy, abnormal muscle tone or seizure activity.     Coordination: Coordination is intact. Coordination normal.     Gait: Gait is intact. Gait normal.     Comments: In/out of chair and on/off exam table without difficulty; gait sure and steady in hallway  Psychiatric:        Mood and Affect: Mood normal.        Speech: Speech normal.        Behavior: Behavior normal. Behavior is cooperative.        Thought Content: Thought content normal.        Judgment: Judgment  normal.           Assessment & Plan:  A-viral uri with cough  P-tessalon pearles 200mg  po TID prn cough #30 RF0 to her pharmacy of choice electronic Rx.  Cough drops po q2h prn cough refused offer from clinic stock. Phenylephrine 10mg  po q6h prn rhinitis given 4 UD from clinic stock.  Tylenol 1000mg  po qid prn pain/fever given 4 UD from clinic stock.  Discussed clear to yellow to green mucous normal progression viral URI. Patient may use normal saline nasal spray 2 sprays each nostril q2h wa as needed. Given 1 bottle from clinic stock  flonase 28mcg 1 spray each nostril BID #1 RF6 electronic Rx to her pharmacy of choice  Patient denied personal or family history of ENT cancer.  OTC antihistamine of choice claritin 10mg  po daily.  Avoid triggers if possible.  Shower prior to bedtime if exposed to triggers.  If allergic dust/dust mites recommend mattress/pillow covers/encasements; washing linens, vacuuming, sweeping, dusting weekly.  Call or return to clinic as needed if these symptoms worsen or fail to improve as anticipated especially teeth pain upper, fever greater than 100.5, productive cough rose/tan/opaque sputum/wheezing/dyspnea.   Exitcare handout on viral uri, allergic rhinitis and sinus rinse given to patient.  Patient verbalized understanding of instructions, agreed with plan of care and had no further questions at this time.  P2:  Avoidance and hand washing.  Cough lozenges po q2h prn cough  Consider Prednisone taper 10mg  (60/50/40/30/20/10mg ) po daily with breakfast #21 RF0 if worsening with plan of care x 48 hours consider Albuterol MDI 18mcg 1-2 puffs po q4-6h prn protracted cough/wheeze #1 RF0 if cough worsening and having wheezing/chest tightness/protracted coughing episodes.   Viruses are the most common cause of bronchial inflammation in otherwise healthy adults with acute bronchitis.  The appearance of sputum is not predictive of whether a bacterial infection is present.  Purulent  sputum is most often caused by viral infections.  There are a small portion of those caused by non-viral agents being Mycoplama pneumonia.  Microscopic examination or C&S of sputum in the healthy adult with acute bronchitis is generally not helpful (usually negative or normal respiratory flora) other considerations being cough from upper respiratory tract  infections, sinusitis or allergic syndromes (mild asthma or viral pneumonia).  Differential Diagnoses:  reactive airway disease (asthma, allergic aspergillosis (eosinophilia), chronic bronchitis, respiratory infection (sinusitis, common cold, pneumonia), congestive heart failure, reflux esophagitis, bronchogenic tumor, aspiration syndromes and/or exposure to pulmonary irritants/smoke. Without high fever, severe dyspnea, lack of physical findings or other risk factors, I will hold on a chest radiograph and CBC at this time.Tylenol 500mg  one to two tablets every four to six hours as needed for fever or myalgias.  No aspirin.  ER if hemopthysis, SOB, worst chest pain of life.   Patient instructed to follow up in one week or sooner if symptoms worsen.  Patient verbalized agreement and understanding of treatment plan.  P2:  hand washing and cover cough

## 2018-06-21 NOTE — Patient Instructions (Signed)
How to Perform a Sinus Rinse A sinus rinse is a home treatment that is used to rinse your sinuses with a sterile mixture of salt and water (saline solution). Sinuses are air-filled spaces in your skull behind the bones of your face and forehead that open into your nasal cavity. A sinus rinse can help to clear mucus, dirt, dust, or pollen from your nasal cavity. You may do a sinus rinse when you have a cold, a virus, nasal allergy symptoms, a sinus infection, or stuffiness in your nose or sinuses. Talk with your health care provider about whether a sinus rinse might help you. What are the risks? A sinus rinse is generally safe and effective. However, there are a few risks, which include:  A burning sensation in your sinuses. This may happen if you do not make the saline solution as directed. Be sure to follow all directions when making the saline solution.  Nasal irritation.  Infection from contaminated water. This is rare, but possible. Do not do a sinus rinse if you have had ear or nasal surgery, ear infection, or blocked ears. Supplies needed:  Saline solution or powder.  Distilled or sterile water may be needed to mix with saline powder. ? You may use boiled and cooled tap water. Boil tap water for 5 minutes; cool until it is lukewarm. Use within 24 hours. ? Do not use regular tap water to mix with the saline solution.  Neti pot or nasal rinse bottle. These supplies release the saline solution into your nose and through your sinuses. Neti pots and nasal rinse bottles can be purchased at [employer], a health food store, or online. How to perform a sinus rinse  1. Wash your hands with soap and water. 2. Wash your device according to the directions that came with the product and then dry it. 3. Use the solution that comes with your product or one that is sold separately in stores. Follow the mixing directions on the package if you need to mix with sterile or distilled  water. 4. Fill the device with the amount of saline solution noted in the device instructions. 5. Stand over a sink and tilt your head sideways over the sink. 6. Place the spout of the device in your upper nostril (the one closer to the ceiling). 7. Gently pour or squeeze the saline solution into your nasal cavity. The liquid should drain out from the lower nostril if you are not too congested. 8. While rinsing, breathe through your open mouth. 9. Gently blow your nose to clear any mucus and rinse solution. Blowing too hard may cause ear pain. 10. Repeat in your other nostril. 11. Clean and rinse your device with clean water and then air-dry it. Talk with your health care provider or pharmacist if you have questions about how to do a sinus rinse. Summary  A sinus rinse is a home treatment that is used to rinse your sinuses with a sterile mixture of salt and water (saline solution).  A sinus rinse is generally safe and effective. Follow all instructions carefully.  Before doing a sinus rinse, talk with your health care provider about whether it would be helpful for you. This information is not intended to replace advice given to you by your health care provider. Make sure you discuss any questions you have with your health care provider. Document Released: 11/08/2013 Document Revised: 02/08/2017 Document Reviewed: 02/08/2017 Elsevier Interactive Patient Education  2019 [employer] Allergic Rhinitis, Adult Allergic rhinitis  is an allergic reaction that affects the mucous membrane inside the nose. It causes sneezing, a runny or stuffy nose, and the feeling of mucus going down the back of the throat (postnasal drip). Allergic rhinitis can be mild to severe. There are two types of allergic rhinitis:  Seasonal. This type is also called hay fever. It happens only during certain seasons.  Perennial. This type can happen at any time of the year. What are the causes? This condition happens when  the body's defense system (immune system) responds to certain harmless substances called allergens as though they were germs.  Seasonal allergic rhinitis is triggered by pollen, which can come from grasses, trees, and weeds. Perennial allergic rhinitis may be caused by:  House dust mites.  Pet dander.  Mold spores. What are the signs or symptoms? Symptoms of this condition include:  Sneezing.  Runny or stuffy nose (nasal congestion).  Postnasal drip.  Itchy nose.  Tearing of the eyes.  Trouble sleeping.  Daytime sleepiness. How is this diagnosed? This condition may be diagnosed based on:  Your medical history.  A physical exam.  Tests to check for related conditions, such as: ? Asthma. ? Pink eye. ? Ear infection. ? Upper respiratory infection.  Tests to find out which allergens trigger your symptoms. These may include skin or blood tests. How is this treated? There is no cure for this condition, but treatment can help control symptoms. Treatment may include:  Taking medicines that block allergy symptoms, such as antihistamines. Medicine may be given as a shot, nasal spray, or pill.  Avoiding the allergen.  Desensitization. This treatment involves getting ongoing shots until your body becomes less sensitive to the allergen. This treatment may be done if other treatments do not help.  If taking medicine and avoiding the allergen does not work, new, stronger medicines may be prescribed. Follow these instructions at home:  Find out what you are allergic to. Common allergens include smoke, dust, and pollen.  Avoid the things you are allergic to. These are some things you can do to help avoid allergens: ? Replace carpet with wood, tile, or vinyl flooring. Carpet can trap dander and dust. ? Do not smoke. Do not allow smoking in your home. ? Change your heating and air conditioning filter at least once a month. ? During allergy season:  Keep windows closed as much  as possible.  Plan outdoor activities when pollen counts are lowest. This is usually during the evening hours.  When coming indoors, change clothing and shower before sitting on furniture or bedding.  Take over-the-counter and prescription medicines only as told by your health care provider.  Keep all follow-up visits as told by your health care provider. This is important. Contact a health care provider if:  You have a fever.  You develop a persistent cough.  You make whistling sounds when you breathe (you wheeze).  Your symptoms interfere with your normal daily activities. Get help right away if:  You have shortness of breath. Summary  This condition can be managed by taking medicines as directed and avoiding allergens.  Contact your health care provider if you develop a persistent cough or fever.  During allergy season, keep windows closed as much as possible. This information is not intended to replace advice given to you by your health care provider. Make sure you discuss any questions you have with your health care provider. Document Released: 01/06/2001 Document Revised: 05/21/2016 Document Reviewed: 05/21/2016 Elsevier Interactive Patient Education  2019  [employer]. Viral Respiratory Infection A respiratory infection is an illness that affects part of the respiratory system, such as the lungs, nose, or throat. A respiratory infection that is caused by a virus is called a viral respiratory infection. Common types of viral respiratory infections include:  A cold.  The flu (influenza).  A respiratory syncytial virus (RSV) infection. What are the causes? This condition is caused by a virus. What are the signs or symptoms? Symptoms of this condition include:  A stuffy or runny nose.  Yellow or green nasal discharge.  A cough.  Sneezing.  Fatigue.  Achy muscles.  A sore throat.  Sweating or chills.  A fever.  A headache. How is this  diagnosed? This condition may be diagnosed based on:  Your symptoms.  A physical exam.  Testing of nasal swabs. How is this treated? This condition may be treated with medicines, such as:  Antiviral medicine. This may shorten the length of time a person has symptoms.  Expectorants. These make it easier to cough up mucus.  Decongestant nasal sprays.  Acetaminophen or NSAIDs to relieve fever and pain. Antibiotic medicines are not prescribed for viral infections. This is because antibiotics are designed to kill bacteria. They are not effective against viruses. Follow these instructions at home:  Managing pain and congestion  Take over-the-counter and prescription medicines only as told by your health care provider.  If you have a sore throat, gargle with a salt-water mixture 3-4 times a day or as needed. To make a salt-water mixture, completely dissolve -1 tsp of salt in 1 cup of warm water.  Use nose drops made from salt water to ease congestion and soften raw skin around your nose.  Drink enough fluid to keep your urine pale yellow. This helps prevent dehydration and helps loosen up mucus. General instructions  Rest as much as possible.  Do not drink alcohol.  Do not use any products that contain nicotine or tobacco, such as cigarettes and e-cigarettes. If you need help quitting, ask your health care provider.  Keep all follow-up visits as told by your health care provider. This is important. How is this prevented?   Get an annual flu shot. You may get the flu shot in late summer, fall, or winter. Ask your health care provider when you should get your flu shot.  Avoid exposing others to your respiratory infection. ? Stay home from work or school as told by your health care provider. ? Wash your hands with soap and water often, especially after you cough or sneeze. If soap and water are not available, use alcohol-based hand sanitizer.  Avoid contact with people who are  sick during cold and flu season. This is generally fall and winter. Contact a health care provider if:  Your symptoms last for 10 days or longer.  Your symptoms get worse over time.  You have a fever.  You have severe sinus pain in your face or forehead.  The glands in your jaw or neck become very swollen. Get help right away if you:  Feel pain or pressure in your chest.  Have shortness of breath.  Faint or feel like you will faint.  Have severe and persistent vomiting.  Feel confused or disoriented. Summary  A respiratory infection is an illness that affects part of the respiratory system, such as the lungs, nose, or throat. A respiratory infection that is caused by a virus is called a viral respiratory infection.  Common types of viral respiratory  infections are a cold, influenza, and respiratory syncytial virus (RSV) infection.  Symptoms of this condition include a stuffy or runny nose, cough, sneezing, fatigue, achy muscles, sore throat, and fevers or chills.  Antibiotic medicines are not prescribed for viral infections. This is because antibiotics are designed to kill bacteria. They are not effective against viruses. This information is not intended to replace advice given to you by your health care provider. Make sure you discuss any questions you have with your health care provider. Document Released: 01/21/2005 Document Revised: 05/24/2017 Document Reviewed: 05/24/2017 Elsevier Interactive Patient Education  2019 [employer].

## 2018-06-27 ENCOUNTER — Ambulatory Visit
Admission: RE | Admit: 2018-06-27 | Discharge: 2018-06-27 | Disposition: A | Discharge status: Home / Self Care | Payer: No Typology Code available for payment source | Source: Ambulatory Visit | Attending: Certified Nurse Midwife | Admitting: Certified Nurse Midwife

## 2018-06-27 ENCOUNTER — Other Ambulatory Visit: Payer: Self-pay | Admitting: Certified Nurse Midwife

## 2018-06-27 DIAGNOSIS — N63 Unspecified lump in unspecified breast: Secondary | ICD-10-CM

## 2018-07-18 ENCOUNTER — Other Ambulatory Visit: Payer: Self-pay

## 2018-07-18 ENCOUNTER — Ambulatory Visit: Payer: Self-pay | Admitting: *Deleted

## 2018-07-18 VITALS — BP 101/83 | HR 83 | Ht 63.5 in | Wt 164.0 lb

## 2018-07-18 DIAGNOSIS — Z8639 Personal history of other endocrine, nutritional and metabolic disease: Secondary | ICD-10-CM

## 2018-07-18 DIAGNOSIS — Z0000 Encounter for general adult medical examination without abnormal findings: Secondary | ICD-10-CM

## 2018-07-18 NOTE — Progress Notes (Signed)
[employer] insurance premium discount evaluation: Labs Drawn. Repl[employer] ROI form signed. Tobacco Free Attestation form signed.  Forms placed in paper chart. Okay to route results to Melvia Heaps, CNM.

## 2018-07-19 ENCOUNTER — Ambulatory Visit: Payer: Self-pay | Admitting: Registered Nurse

## 2018-07-19 ENCOUNTER — Encounter: Payer: Self-pay | Admitting: Registered Nurse

## 2018-07-19 VITALS — BP 124/76 | HR 74 | Temp 98.0°F | Resp 16

## 2018-07-19 DIAGNOSIS — E039 Hypothyroidism, unspecified: Secondary | ICD-10-CM

## 2018-07-19 DIAGNOSIS — D638 Anemia in other chronic diseases classified elsewhere: Secondary | ICD-10-CM

## 2018-07-19 DIAGNOSIS — M7052 Other bursitis of knee, left knee: Secondary | ICD-10-CM

## 2018-07-19 DIAGNOSIS — M7652 Patellar tendinitis, left knee: Secondary | ICD-10-CM

## 2018-07-19 DIAGNOSIS — E559 Vitamin D deficiency, unspecified: Secondary | ICD-10-CM

## 2018-07-19 DIAGNOSIS — E663 Overweight: Secondary | ICD-10-CM

## 2018-07-19 LAB — CMP12+LP+TP+TSH+6AC+CBC/D/PLT
ALT: 11 IU/L (ref 0–32)
AST: 18 IU/L (ref 0–40)
Albumin/Globulin Ratio: 1.3 (ref 1.2–2.2)
Albumin: 4 g/dL (ref 3.8–4.9)
Alkaline Phosphatase: 64 IU/L (ref 39–117)
BUN/Creatinine Ratio: 14 (ref 9–23)
BUN: 10 mg/dL (ref 6–24)
Basophils Absolute: 0 10*3/uL (ref 0.0–0.2)
Basos: 0 %
Bilirubin Total: 0.5 mg/dL (ref 0.0–1.2)
Calcium: 9 mg/dL (ref 8.7–10.2)
Chloride: 101 mmol/L (ref 96–106)
Chol/HDL Ratio: 2.6 ratio (ref 0.0–4.4)
Cholesterol, Total: 135 mg/dL (ref 100–199)
Creatinine, Ser: 0.71 mg/dL (ref 0.57–1.00)
EOS (ABSOLUTE): 0.1 10*3/uL (ref 0.0–0.4)
Eos: 1 %
Estimated CHD Risk: <0.5 times avg. (ref 0.0–1.0)
Free Thyroxine Index: 3.2 (ref 1.2–4.9)
GFR calc Af Amer: 112 mL/min/{1.73_m2} (ref >59)
GFR calc non Af Amer: 98 mL/min/{1.73_m2} (ref >59)
GGT: 20 IU/L (ref 0–60)
Globulin, Total: 3 g/dL (ref 1.5–4.5)
Glucose: 84 mg/dL (ref 65–99)
HDL: 51 mg/dL (ref >39)
Hematocrit: 32.1 % — ABNORMAL LOW (ref 34.0–46.6)
Hemoglobin: 11.1 g/dL (ref 11.1–15.9)
Immature Grans (Abs): 0 10*3/uL (ref 0.0–0.1)
Immature Granulocytes: 0 %
Iron: 68 ug/dL (ref 27–159)
LDH: 149 IU/L (ref 119–226)
LDL Calculated: 78 mg/dL (ref 0–99)
Lymphocytes Absolute: 2.1 10*3/uL (ref 0.7–3.1)
Lymphs: 33 %
MCH: 31.4 pg (ref 26.6–33.0)
MCHC: 34.6 g/dL (ref 31.5–35.7)
MCV: 91 fL (ref 79–97)
Monocytes Absolute: 0.5 10*3/uL (ref 0.1–0.9)
Monocytes: 7 %
Neutrophils Absolute: 3.7 10*3/uL (ref 1.4–7.0)
Neutrophils: 59 %
Phosphorus: 3.9 mg/dL (ref 3.0–4.3)
Platelets: 293 10*3/uL (ref 150–450)
Potassium: 3.9 mmol/L (ref 3.5–5.2)
RBC: 3.53 x10E6/uL — ABNORMAL LOW (ref 3.77–5.28)
RDW: 12.1 % (ref 11.7–15.4)
Sodium: 144 mmol/L (ref 134–144)
T3 Uptake Ratio: 29 % (ref 24–39)
T4, Total: 10.9 ug/dL (ref 4.5–12.0)
TSH: 0.311 u[IU]/mL — ABNORMAL LOW (ref 0.450–4.500)
Total Protein: 7 g/dL (ref 6.0–8.5)
Triglycerides: 29 mg/dL (ref 0–149)
Uric Acid: 4.7 mg/dL (ref 2.5–7.1)
VLDL Cholesterol Cal: 6 mg/dL (ref 5–40)
WBC: 6.3 10*3/uL (ref 3.4–10.8)

## 2018-07-19 LAB — HGB A1C W/O EAG: Hgb A1c MFr Bld: 5.2 % (ref 4.8–5.6)

## 2018-07-19 LAB — VITAMIN D 25 HYDROXY (VIT D DEFICIENCY, FRACTURES): Vit D, 25-Hydroxy: 15 ng/mL — ABNORMAL LOW (ref 30.0–100.0)

## 2018-07-19 MED ORDER — MENTHOL (TOPICAL ANALGESIC) 4 % EX GEL: 1.0000 "application " | Freq: Four times a day (QID) | CUTANEOUS | 0 refills | Status: AC | PRN

## 2018-07-19 MED ORDER — IBUPROFEN 800 MG PO TABS: 800.0000 mg | ORAL_TABLET | Freq: Three times a day (TID) | ORAL | 0 refills | Status: AC | PRN

## 2018-07-19 NOTE — Patient Instructions (Addendum)
Exercising to Lose Weight Exercise is structured, repetitive physical activity to improve fitness and health. Getting regular exercise is important for everyone. It is especially important if you are overweight. Being overweight increases your risk of heart disease, stroke, diabetes, high blood pressure, and several types of cancer. Reducing your calorie intake and exercising can help you lose weight. Exercise is usually categorized as moderate or vigorous intensity. To lose weight, most people need to do a certain amount of moderate-intensity or vigorous-intensity exercise each week. Moderate-intensity exercise  Moderate-intensity exercise is any activity that gets you moving enough to burn at least three times more energy (calories) than if you were sitting. Examples of moderate exercise include:  Walking a mile in 15 minutes.  Doing light yard work.  Biking at an easy pace. Most people should get at least 150 minutes (2 hours and 30 minutes) a week of moderate-intensity exercise to maintain their body weight. Vigorous-intensity exercise Vigorous-intensity exercise is any activity that gets you moving enough to burn at least six times more calories than if you were sitting. When you exercise at this intensity, you should be working hard enough that you are not able to carry on a conversation. Examples of vigorous exercise include:  Running.  Playing a team sport, such as football, basketball, and soccer.  Jumping rope. Most people should get at least 75 minutes (1 hour and 15 minutes) a week of vigorous-intensity exercise to maintain their body weight. How can exercise affect me? When you exercise enough to burn more calories than you eat, you lose weight. Exercise also reduces body fat and builds muscle. The more muscle you have, the more calories you burn. Exercise also:  Improves mood.  Reduces stress and tension.  Improves your overall fitness, flexibility, and  endurance.  Increases bone strength. The amount of exercise you need to lose weight depends on:  Your age.  The type of exercise.  Any health conditions you have.  Your overall physical ability. Talk to your health care provider about how much exercise you need and what types of activities are safe for you. What actions can I take to lose weight? Nutrition   Make changes to your diet as told by your health care provider or diet and nutrition specialist (dietitian). This may include: ? Eating fewer calories. ? Eating more protein. ? Eating less unhealthy fats. ? Eating a diet that includes fresh fruits and vegetables, whole grains, low-fat dairy products, and lean protein. ? Avoiding foods with added fat, salt, and sugar.  Drink plenty of water while you exercise to prevent dehydration or heat stroke. Activity  Choose an activity that you enjoy and set realistic goals. Your health care provider can help you make an exercise plan that works for you.  Exercise at a moderate or vigorous intensity most days of the week. ? The intensity of exercise may vary from person to person. You can tell how intense a workout is for you by paying attention to your breathing and heartbeat. Most people will notice their breathing and heartbeat get faster with more intense exercise.  Do resistance training twice each week, such as: ? Push-ups. ? Sit-ups. ? Lifting weights. ? Using resistance bands.  Getting short amounts of exercise can be just as helpful as long structured periods of exercise. If you have trouble finding time to exercise, try to include exercise in your daily routine. ? Get up, stretch, and walk around every 30 minutes throughout the day. ? Go for a  walk during your lunch break. ? Park your car farther away from your destination. ? If you take public transportation, get off one stop early and walk the rest of the way. ? Make phone calls while standing up and walking  around. ? Take the stairs instead of elevators or escalators.  Wear comfortable clothes and shoes with good support.  Do not exercise so much that you hurt yourself, feel dizzy, or get very short of breath. Where to find more information  U.S. Department of Health and Human Services: BondedCompany.at  Centers for Disease Control and Prevention (CDC): http://www.wolf.info/ Contact a health care provider:  Before starting a new exercise program.  If you have questions or concerns about your weight.  If you have a medical problem that keeps you from exercising. Get help right away if you have any of the following while exercising:  Injury.  Dizziness.  Difficulty breathing or shortness of breath that does not go away when you stop exercising.  Chest pain.  Rapid heartbeat. Summary  Being overweight increases your risk of heart disease, stroke, diabetes, high blood pressure, and several types of cancer.  Losing weight happens when you burn more calories than you eat.  Reducing the amount of calories you eat in addition to getting regular moderate or vigorous exercise each week helps you lose weight. This information is not intended to replace advice given to you by your health care provider. Make sure you discuss any questions you have with your health care provider. Document Released: 05/16/2010 Document Revised: 04/26/2017 Document Reviewed: 04/26/2017 Elsevier Interactive Patient Education  2019 [place] refers to food and lifestyle choices that are based on the traditions of countries located on the [employer]. This way of eating has been shown to help prevent certain conditions and improve outcomes for people who have chronic diseases, like kidney disease and heart disease. What are tips for following this plan? Lifestyle  Cook and eat meals together with your family, when possible.  Drink enough fluid to keep your urine clear or  pale yellow.  Be physically active every day. This includes: ? Aerobic exercise like running or swimming. ? Leisure activities like gardening, walking, or housework.  Get 7-8 hours of sleep each night.  If recommended by your health care provider, drink red wine in moderation. This means 1 glass a day for nonpregnant women and 2 glasses a day for men. A glass of wine equals 5 oz (150 mL). Reading food labels   Check the serving size of packaged foods. For foods such as rice and pasta, the serving size refers to the amount of cooked product, not dry.  Check the total fat in packaged foods. Avoid foods that have saturated fat or trans fats.  Check the ingredients list for added sugars, such as corn syrup. Shopping  At the grocery store, buy most of your food from the areas near the walls of the store. This includes: ? Fresh fruits and vegetables (produce). ? Grains, beans, nuts, and seeds. Some of these may be available in unpackaged forms or large amounts (in bulk). ? Fresh seafood. ? Poultry and eggs. ? Low-fat dairy products.  Buy whole ingredients instead of prepackaged foods.  Buy fresh fruits and vegetables in-season from local farmers markets.  Buy frozen fruits and vegetables in resealable bags.  If you do not have access to quality fresh seafood, buy precooked frozen shrimp or canned fish, such as tuna, salmon, or sardines.  Buy small amounts of raw or cooked vegetables, salads, or olives from the deli or salad bar at your store.  Stock your pantry so you always have certain foods on hand, such as olive oil, canned tuna, canned tomatoes, rice, pasta, and beans. Cooking  Cook foods with extra-virgin olive oil instead of using butter or other vegetable oils.  Have meat as a side dish, and have vegetables or grains as your main dish. This means having meat in small portions or adding small amounts of meat to foods like pasta or stew.  Use beans or vegetables instead of  meat in common dishes like chili or lasagna.  Experiment with different cooking methods. Try roasting or broiling vegetables instead of steaming or sauteing them.  Add frozen vegetables to soups, stews, pasta, or rice.  Add nuts or seeds for added healthy fat at each meal. You can add these to yogurt, salads, or vegetable dishes.  Marinate fish or vegetables using olive oil, lemon juice, garlic, and fresh herbs. Meal planning   Plan to eat 1 vegetarian meal one day each week. Try to work up to 2 vegetarian meals, if possible.  Eat seafood 2 or more times a week.  Have healthy snacks readily available, such as: ? Vegetable sticks with hummus. ? [place] yogurt. ? Fruit and nut trail mix.  Eat balanced meals throughout the week. This includes: ? Fruit: 2-3 servings a day ? Vegetables: 4-5 servings a day ? Low-fat dairy: 2 servings a day ? Fish, poultry, or lean meat: 1 serving a day ? Beans and legumes: 2 or more servings a week ? Nuts and seeds: 1-2 servings a day ? Whole grains: 6-8 servings a day ? Extra-virgin olive oil: 3-4 servings a day  Limit red meat and sweets to only a few servings a month What are my food choices?  Mediterranean diet ? Recommended ? Grains: Whole-grain pasta. Brown rice. Bulgar wheat. Polenta. Couscous. Whole-wheat bread. [place]. ? Vegetables: Artichokes. Beets. Broccoli. Cabbage. Carrots. Eggplant. Green beans. Chard. Kale. Spinach. Onions. Leeks. Peas. Squash. Tomatoes. Peppers. Radishes. ? Fruits: Apples. Apricots. Avocado. Berries. Bananas. Cherries. Dates. Figs. Grapes. Lemons. Melon. Oranges. Peaches. Plums. Pomegranate. ? Meats and other protein foods: Beans. Almonds. Sunflower seeds. Pine nuts. Peanuts. [place]. Salmon. Scallops. Shrimp. [place]. Tilapia. Clams. Oysters. Eggs. ? Dairy: Low-fat milk. Cheese. Greek yogurt. ? Beverages: Water. Red wine. Herbal tea. ? Fats and oils: Extra virgin olive oil. Avocado oil. Grape seed oil. ? Sweets  and desserts: [place] yogurt with honey. Baked apples. Poached pears. Trail mix. ? Seasoning and other foods: Basil. Cilantro. Coriander. Cumin. Mint. Parsley. Sage. Rosemary. Tarragon. Garlic. Oregano. Thyme. Pepper. Balsalmic vinegar. Tahini. Hummus. Tomato sauce. Olives. Mushrooms. ? Limit these ? Grains: Prepackaged pasta or rice dishes. Prepackaged cereal with added sugar. ? Vegetables: Deep fried potatoes (french fries). ? Fruits: Fruit canned in syrup. ? Meats and other protein foods: Beef. Pork. Lamb. Poultry with skin. Hot dogs. Berniece Salines. ? Dairy: Ice cream. Sour cream. Whole milk. ? Beverages: Juice. Sugar-sweetened soft drinks. Beer. Liquor and spirits. ? Fats and oils: Butter. Canola oil. Vegetable oil. Beef fat (tallow). Lard. ? Sweets and desserts: Cookies. Cakes. Pies. Candy. ? Seasoning and other foods: Mayonnaise. Premade sauces and marinades. ? The items listed may not be a complete list. Talk with your dietitian about what dietary choices are right for you. Summary  The Mediterranean diet includes both food and lifestyle choices.  Eat a variety of fresh fruits and vegetables, beans, nuts,  seeds, and whole grains.  Limit the amount of red meat and sweets that you eat.  Talk with your health care provider about whether it is safe for you to drink red wine in moderation. This means 1 glass a day for nonpregnant women and 2 glasses a day for men. A glass of wine equals 5 oz (150 mL). This information is not intended to replace advice given to you by your health care provider. Make sure you discuss any questions you have with your health care provider. Document Released: 12/05/2015 Document Revised: 01/07/2016 Document Reviewed: 12/05/2015 Elsevier Interactive Patient Education  2019 [place] DASH stands for "Dietary Approaches to Stop Hypertension." The DASH eating plan is a healthy eating plan that has been shown to reduce high blood pressure  (hypertension). It may also reduce your risk for type 2 diabetes, heart disease, and stroke. The DASH eating plan may also help with weight loss. What are tips for following this plan?  General guidelines  Avoid eating more than 2,300 mg (milligrams) of salt (sodium) a day. If you have hypertension, you may need to reduce your sodium intake to 1,500 mg a day.  Limit alcohol intake to no more than 1 drink a day for nonpregnant women and 2 drinks a day for men. One drink equals 12 oz of beer, 5 oz of wine, or 1 oz of hard liquor.  Work with your health care provider to maintain a healthy body weight or to lose weight. Ask what an ideal weight is for you.  Get at least 30 minutes of exercise that causes your heart to beat faster (aerobic exercise) most days of the week. Activities may include walking, swimming, or biking.  Work with your health care provider or diet and nutrition specialist (dietitian) to adjust your eating plan to your individual calorie needs. Reading food labels   Check food labels for the amount of sodium per serving. Choose foods with less than 5 percent of the Daily Value of sodium. Generally, foods with less than 300 mg of sodium per serving fit into this eating plan.  To find whole grains, look for the word "whole" as the first word in the ingredient list. Shopping  Buy products labeled as "low-sodium" or "no salt added."  Buy fresh foods. Avoid canned foods and premade or frozen meals. Cooking  Avoid adding salt when cooking. Use salt-free seasonings or herbs instead of table salt or sea salt. Check with your health care provider or pharmacist before using salt substitutes.  Do not fry foods. Cook foods using healthy methods such as baking, boiling, grilling, and broiling instead.  Cook with heart-healthy oils, such as olive, canola, soybean, or sunflower oil. Meal planning  Eat a balanced diet that includes: ? 5 or more servings of fruits and vegetables  each day. At each meal, try to fill half of your plate with fruits and vegetables. ? Up to 6-8 servings of whole grains each day. ? Less than 6 oz of lean meat, poultry, or fish each day. A 3-oz serving of meat is about the same size as a deck of cards. One egg equals 1 oz. ? 2 servings of low-fat dairy each day. ? A serving of nuts, seeds, or beans 5 times each week. ? Heart-healthy fats. Healthy fats called Omega-3 fatty acids are found in foods such as flaxseeds and coldwater fish, like sardines, salmon, and mackerel.  Limit how much you eat of the following: ? Canned or prepackaged  foods. ? Food that is high in trans fat, such as fried foods. ? Food that is high in saturated fat, such as fatty meat. ? Sweets, desserts, sugary drinks, and other foods with added sugar. ? Full-fat dairy products.  Do not salt foods before eating.  Try to eat at least 2 vegetarian meals each week.  Eat more home-cooked food and less restaurant, buffet, and fast food.  When eating at a restaurant, ask that your food be prepared with less salt or no salt, if possible. What foods are recommended? The items listed may not be a complete list. Talk with your dietitian about what dietary choices are best for you. Grains Whole-grain or whole-wheat bread. Whole-grain or whole-wheat pasta. Brown rice. [place]. Bulgur. Whole-grain and low-sodium cereals. Pita bread. Low-fat, low-sodium crackers. Whole-wheat flour tortillas. Vegetables Fresh or frozen vegetables (raw, steamed, roasted, or grilled). Low-sodium or reduced-sodium tomato and vegetable juice. Low-sodium or reduced-sodium tomato sauce and tomato paste. Low-sodium or reduced-sodium canned vegetables. Fruits All fresh, dried, or frozen fruit. Canned fruit in natural juice (without added sugar). Meat and other protein foods Skinless chicken or [place]. Ground chicken or [place]. Pork with fat trimmed off. Fish and seafood. Egg whites. Dried beans,  peas, or lentils. Unsalted nuts, nut butters, and seeds. Unsalted canned beans. Lean cuts of beef with fat trimmed off. Low-sodium, lean deli meat. Dairy Low-fat (1%) or fat-free (skim) milk. Fat-free, low-fat, or reduced-fat cheeses. Nonfat, low-sodium ricotta or cottage cheese. Low-fat or nonfat yogurt. Low-fat, low-sodium cheese. Fats and oils Soft margarine without trans fats. Vegetable oil. Low-fat, reduced-fat, or light mayonnaise and salad dressings (reduced-sodium). Canola, safflower, olive, soybean, and sunflower oils. Avocado. Seasoning and other foods Herbs. Spices. Seasoning mixes without salt. Unsalted popcorn and pretzels. Fat-free sweets. What foods are not recommended? The items listed may not be a complete list. Talk with your dietitian about what dietary choices are best for you. Grains Baked goods made with fat, such as croissants, muffins, or some breads. Dry pasta or rice meal packs. Vegetables Creamed or fried vegetables. Vegetables in a cheese sauce. Regular canned vegetables (not low-sodium or reduced-sodium). Regular canned tomato sauce and paste (not low-sodium or reduced-sodium). Regular tomato and vegetable juice (not low-sodium or reduced-sodium). Angie Fava. Olives. Fruits Canned fruit in a light or heavy syrup. Fried fruit. Fruit in cream or butter sauce. Meat and other protein foods Fatty cuts of meat. Ribs. Fried meat. Berniece Salines. Sausage. Bologna and other processed lunch meats. Salami. Fatback. Hotdogs. Bratwurst. Salted nuts and seeds. Canned beans with added salt. Canned or smoked fish. Whole eggs or egg yolks. Chicken or [place] with skin. Dairy Whole or 2% milk, cream, and half-and-half. Whole or full-fat cream cheese. Whole-fat or sweetened yogurt. Full-fat cheese. Nondairy creamers. Whipped toppings. Processed cheese and cheese spreads. Fats and oils Butter. Stick margarine. Lard. Shortening. Ghee. Bacon fat. Tropical oils, such as coconut, palm kernel, or palm  oil. Seasoning and other foods Salted popcorn and pretzels. Onion salt, garlic salt, seasoned salt, table salt, and sea salt. Worcestershire sauce. Tartar sauce. Barbecue sauce. Teriyaki sauce. Soy sauce, including reduced-sodium. Steak sauce. Canned and packaged gravies. Fish sauce. Oyster sauce. Cocktail sauce. Horseradish that you find on the shelf. Ketchup. Mustard. Meat flavorings and tenderizers. Bouillon cubes. Hot sauce and Tabasco sauce. Premade or packaged marinades. Premade or packaged taco seasonings. Relishes. Regular salad dressings. Where to find more information:  National Heart, Lung, and [place]: https://wilson-eaton.com/  American Heart Association: www.heart.org Summary  The DASH eating  plan is a healthy eating plan that has been shown to reduce high blood pressure (hypertension). It may also reduce your risk for type 2 diabetes, heart disease, and stroke.  With the DASH eating plan, you should limit salt (sodium) intake to 2,300 mg a day. If you have hypertension, you may need to reduce your sodium intake to 1,500 mg a day.  When on the DASH eating plan, aim to eat more fresh fruits and vegetables, whole grains, lean proteins, low-fat dairy, and heart-healthy fats.  Work with your health care provider or diet and nutrition specialist (dietitian) to adjust your eating plan to your individual calorie needs. This information is not intended to replace advice given to you by your health care provider. Make sure you discuss any questions you have with your health care provider. Document Released: 04/02/2011 Document Revised: 04/06/2016 Document Reviewed: 04/06/2016 Elsevier Interactive Patient Education  2019 Elsevier Inc. Preventing Hypertension Hypertension, commonly called high blood pressure, is when the force of blood pumping through the arteries is too strong. Arteries are blood vessels that carry blood from the heart throughout the body. Over time, hypertension can damage  the arteries and decrease blood flow to important parts of the body, including the brain, heart, and kidneys. Often, hypertension does not cause symptoms until blood pressure is very high. For this reason, it is important to have your blood pressure checked on a regular basis. Hypertension can often be prevented with diet and lifestyle changes. If you already have hypertension, you can control it with diet and lifestyle changes, as well as medicine. What nutrition changes can be made? Maintain a healthy diet. This includes:  Eating less salt (sodium). Ask your health care provider how much sodium is safe for you to have. The general recommendation is to consume less than 1 tsp (2,300 mg) of sodium a day. ? Do not add salt to your food. ? Choose low-sodium options when grocery shopping and eating out.  Limiting fats in your diet. You can do this by eating low-fat or fat-free dairy products and by eating less red meat.  Eating more fruits, vegetables, and whole grains. Make a goal to eat: ? 1-2 cups of fresh fruits and vegetables each day. ? 3-4 servings of whole grains each day.  Avoiding foods and beverages that have added sugars.  Eating fish that contain healthy fats (omega-3 fatty acids), such as mackerel or salmon. If you need help putting together a healthy eating plan, try the DASH diet. This diet is high in fruits, vegetables, and whole grains. It is low in sodium, red meat, and added sugars. DASH stands for Dietary Approaches to Stop Hypertension. What lifestyle changes can be made?   Lose weight if you are overweight. Losing just 3?5% of your body weight can help prevent or control hypertension. ? For example, if your present weight is 200 lb (91 kg), a loss of 3-5% of your weight means losing 6-10 lb (2.7-4.5 kg). ? Ask your health care provider to help you with a diet and exercise plan to safely lose weight.  Get enough exercise. Do at least 150 minutes of moderate-intensity  exercise each week. ? You could do this in short exercise sessions several times a day, or you could do longer exercise sessions a few times a week. For example, you could take a brisk 10-minute walk or bike ride, 3 times a day, for 5 days a week.  Find ways to reduce stress, such as exercising, meditating, listening to  music, or taking a yoga class. If you need help reducing stress, ask your health care provider.  Do not smoke. This includes e-cigarettes. Chemicals in tobacco and nicotine products raise your blood pressure each time you smoke. If you need help quitting, ask your health care provider.  Avoid alcohol. If you drink alcohol, limit alcohol intake to no more than 1 drink a day for nonpregnant women and 2 drinks a day for men. One drink equals 12 oz of beer, 5 oz of wine, or 1 oz of hard liquor. Why are these changes important? Diet and lifestyle changes can help you prevent hypertension, and they may make you feel better overall and improve your quality of life. If you have hypertension, making these changes will help you control it and help prevent major complications, such as:  Hardening and narrowing of arteries that supply blood to: ? Your heart. This can cause a heart attack. ? Your brain. This can cause a stroke. ? Your kidneys. This can cause kidney failure.  Stress on your heart muscle, which can cause heart failure. What can I do to lower my risk?  Work with your health care provider to make a hypertension prevention plan that works for you. Follow your plan and keep all follow-up visits as told by your health care provider.  Learn how to check your blood pressure at home. Make sure that you know your personal target blood pressure, as told by your health care provider. How is this treated? In addition to diet and lifestyle changes, your health care provider may recommend medicines to help lower your blood pressure. You may need to try a few different medicines to find  what works best for you. You also may need to take more than one medicine. Take over-the-counter and prescription medicines only as told by your health care provider. Where to find support Your health care provider can help you prevent hypertension and help you keep your blood pressure at a healthy level. Your local hospital or your community may also provide support services and prevention programs. The American Heart Association offers an online support network at: CheapBootlegs.com.cy Where to find more information Learn more about hypertension from:  [place], Lung, and Blood Institute: ElectronicHangman.is  Centers for Disease Control and Prevention: https://ingram.com/  American Academy of Family Physicians: http://familydoctor.org/familydoctor/en/diseases-conditions/high-blood-pressure.printerview.all.html Learn more about the DASH diet from:  [place], Lung, and [place]: https://www.reyes.com/ Contact a health care provider if:  You think you are having a reaction to medicines you have taken.  You have recurrent headaches or feel dizzy.  You have swelling in your ankles.  You have trouble with your vision. Summary  Hypertension often does not cause any symptoms until blood pressure is very high. It is important to get your blood pressure checked regularly.  Diet and lifestyle changes are the most important steps in preventing hypertension.  By keeping your blood pressure in a healthy range, you can prevent complications like heart attack, heart failure, stroke, and kidney failure.  Work with your health care provider to make a hypertension prevention plan that works for you. This information is not intended to replace advice given to you by your health care provider. Make sure you discuss any questions you have with your health care provider. Document Released:  04/28/2015 Document Revised: 12/23/2015 Document Reviewed: 12/23/2015 Elsevier Interactive Patient Education  2019 [employer]. Hypothyroidism  Hypothyroidism is when the thyroid gland does not make enough of certain hormones (it is underactive). The thyroid  gland is a small gland located in the lower front part of the neck, just in front of the windpipe (trachea). This gland makes hormones that help control how the body uses food for energy (metabolism) as well as how the heart and brain function. These hormones also play a role in keeping your bones strong. When the thyroid is underactive, it produces too little of the hormones thyroxine (T4) and triiodothyronine (T3). What are the causes? This condition may be caused by:  Hashimoto's disease. This is a disease in which the body's disease-fighting system (immune system) attacks the thyroid gland. This is the most common cause.  Viral infections.  Pregnancy.  Certain medicines.  Birth defects.  Past radiation treatments to the head or neck for cancer.  Past treatment with radioactive iodine.  Past exposure to radiation in the environment.  Past surgical removal of part or all of the thyroid.  Problems with a gland in the center of the brain (pituitary gland).  Lack of enough iodine in the diet. What increases the risk? You are more likely to develop this condition if:  You are female.  You have a family history of thyroid conditions.  You use a medicine called lithium.  You take medicines that affect the immune system (immunosuppressants). What are the signs or symptoms? Symptoms of this condition include:  Feeling as though you have no energy (lethargy).  Not being able to tolerate cold.  Weight gain that is not explained by a change in diet or exercise habits.  Lack of appetite.  Dry skin.  Coarse hair.  Menstrual irregularity.  Slowing of thought processes.  Constipation.  Sadness or depression. How  is this diagnosed? This condition may be diagnosed based on:  Your symptoms, your medical history, and a physical exam.  Blood tests. You may also have imaging tests, such as an ultrasound or MRI. How is this treated? This condition is treated with medicine that replaces the thyroid hormones that your body does not make. After you begin treatment, it may take several weeks for symptoms to go away. Follow these instructions at home:  Take over-the-counter and prescription medicines only as told by your health care provider.  If you start taking any new medicines, tell your health care provider.  Keep all follow-up visits as told by your health care provider. This is important. ? As your condition improves, your dosage of thyroid hormone medicine may change. ? You will need to have blood tests regularly so that your health care provider can monitor your condition. Contact a health care provider if:  Your symptoms do not get better with treatment.  You are taking thyroid replacement medicine and you: ? Sweat a lot. ? Have tremors. ? Feel anxious. ? Lose weight rapidly. ? Cannot tolerate heat. ? Have emotional swings. ? Have diarrhea. ? Feel weak. Get help right away if you have:  Chest pain.  An irregular heartbeat.  A rapid heartbeat.  Difficulty breathing. Summary  Hypothyroidism is when the thyroid gland does not make enough of certain hormones (it is underactive).  When the thyroid is underactive, it produces too little of the hormones thyroxine (T4) and triiodothyronine (T3).  The most common cause is Hashimoto's disease, a disease in which the body's disease-fighting system (immune system) attacks the thyroid gland. The condition can also be caused by viral infections, medicine, pregnancy, or past radiation treatment to the head or neck.  Symptoms may include weight gain, dry skin, constipation, feeling as though you  do not have energy, and not being able to  tolerate cold.  This condition is treated with medicine to replace the thyroid hormones that your body does not make. This information is not intended to replace advice given to you by your health care provider. Make sure you discuss any questions you have with your health care provider. Document Released: 04/13/2005 Document Revised: 03/24/2017 Document Reviewed: 03/24/2017 Elsevier Interactive Patient Education  2019 [employer]. Hyperthyroidism  Hyperthyroidism is when the thyroid gland is too active (overactive). The thyroid gland is a small gland located in the lower front part of the neck, just in front of the windpipe (trachea). This gland makes hormones that help control how the body uses food for energy (metabolism) as well as how the heart and brain function. These hormones also play a role in keeping your bones strong. When the thyroid is overactive, it produces too much of a hormone called thyroxine. What are the causes? This condition may be caused by:  Graves' disease. This is a disorder in which the body's disease-fighting system (immune system) attacks the thyroid gland. This is the most common cause.  Inflammation of the thyroid gland.  A tumor in the thyroid gland.  Use of certain medicines, including: ? Prescription thyroid hormone replacement. ? Herbal supplements that mimic thyroid hormones. ? Amiodarone therapy.  Solid or fluid-filled lumps within your thyroid gland (thyroid nodules).  Taking in a large amount of iodine from foods or medicines. What increases the risk? You are more likely to develop this condition if:  You are female.  You have a family history of thyroid conditions.  You smoke tobacco.  You use a medicine called lithium.  You take medicines that affect the immune system (immunosuppressants). What are the signs or symptoms? Symptoms of this condition include:  Nervousness.  Inability to tolerate heat.  Unexplained weight  loss.  Diarrhea.  Change in the texture of hair or skin.  Heart skipping beats or making extra beats.  Rapid heart rate.  Loss of menstruation.  Shaky hands.  Fatigue.  Restlessness.  Sleep problems.  Enlarged thyroid gland or a lump in the thyroid (nodule). You may also have symptoms of Graves' disease, which may include:  Protruding eyes.  Dry eyes.  Red or swollen eyes.  Problems with vision. How is this diagnosed? This condition may be diagnosed based on:  Your symptoms and medical history.  A physical exam.  Blood tests.  Thyroid ultrasound. This test involves using sound waves to produce images of the thyroid gland.  A thyroid scan. A radioactive substance is injected into a vein, and images show how much iodine is present in the thyroid.  Radioactive iodine uptake test (RAIU). A small amount of radioactive iodine is given by mouth to see how much iodine the thyroid absorbs after a certain amount of time. How is this treated? Treatment depends on the cause and severity of the condition. Treatment may include:  Medicines to reduce the amount of thyroid hormone your body makes.  Radioactive iodine treatment (radioiodine therapy). This involves swallowing a small dose of radioactive iodine, in capsule or liquid form, to kill thyroid cells.  Surgery to remove part or all of your thyroid gland. You may need to take thyroid hormone replacement medicine for the rest of your life after thyroid surgery.  Medicines to help manage your symptoms. Follow these instructions at home:   Take over-the-counter and prescription medicines only as told by your health care provider.  Do  not use any products that contain nicotine or tobacco, such as cigarettes and e-cigarettes. If you need help quitting, ask your health care provider.  Follow any instructions from your health care provider about diet. You may be instructed to limit foods that contain iodine.  Keep all  follow-up visits as told by your health care provider. This is important. ? You will need to have blood tests regularly so that your health care provider can monitor your condition. Contact a health care provider if:  Your symptoms do not get better with treatment.  You have a fever.  You are taking thyroid hormone replacement medicine and you: ? Have symptoms of depression. ? Feel like you are tired all the time. ? Gain weight. Get help right away if:  You have chest pain.  You have decreased alertness or a change in your awareness.  You have abdominal pain.  You feel dizzy.  You have a rapid heartbeat.  You have an irregular heartbeat.  You have difficulty breathing. Summary  The thyroid gland is a small gland located in the lower front part of the neck, just in front of the windpipe (trachea).  Hyperthyroidism is when the thyroid gland is too active (overactive) and produces too much of a hormone called thyroxine.  The most common cause is Graves' disease, a disorder in which your immune system attacks the thyroid gland.  Hyperthyroidism can cause various symptoms, such as unexplained weight loss, nervousness, inability to tolerate heat, or changes in your heartbeat.  Treatment may include medicine to reduce the amount of thyroid hormone your body makes, radioiodine therapy, surgery, or medicines to manage symptoms. This information is not intended to replace advice given to you by your health care provider. Make sure you discuss any questions you have with your health care provider. Document Released: 04/13/2005 Document Revised: 03/24/2017 Document Reviewed: 03/24/2017 Elsevier Interactive Patient Education  2019 [employer] Anemia  Anemia is a condition in which you do not have enough red blood cells or hemoglobin. Hemoglobin is a substance in red blood cells that carries oxygen. When you do not have enough red blood cells or hemoglobin (are anemic), your body  cannot get enough oxygen and your organs may not work properly. As a result, you may feel very tired or have other problems. What are the causes? Common causes of anemia include:  Excessive bleeding. Anemia can be caused by excessive bleeding inside or outside the body, including bleeding from the intestine or from periods in women.  Poor nutrition.  Long-lasting (chronic) kidney, thyroid, and liver disease.  Bone marrow disorders.  Cancer and treatments for cancer.  HIV (human immunodeficiency virus) and AIDS (acquired immunodeficiency syndrome).  Treatments for HIV and AIDS.  Spleen problems.  Blood disorders.  Infections, medicines, and autoimmune disorders that destroy red blood cells. What are the signs or symptoms? Symptoms of this condition include:  Minor weakness.  Dizziness.  Headache.  Feeling heartbeats that are irregular or faster than normal (palpitations).  Shortness of breath, especially with exercise.  Paleness.  Cold sensitivity.  Indigestion.  Nausea.  Difficulty sleeping.  Difficulty concentrating. Symptoms may occur suddenly or develop slowly. If your anemia is mild, you may not have symptoms. How is this diagnosed? This condition is diagnosed based on:  Blood tests.  Your medical history.  A physical exam.  Bone marrow biopsy. Your health care provider may also check your stool (feces) for blood and may do additional testing to look for the cause of  your bleeding. You may also have other tests, including:  Imaging tests, such as a CT scan or MRI.  Endoscopy.  Colonoscopy. How is this treated? Treatment for this condition depends on the cause. If you continue to lose a lot of blood, you may need to be treated at a hospital. Treatment may include:  Taking supplements of iron, vitamin P95, or folic acid.  Taking a hormone medicine (erythropoietin) that can help to stimulate red blood cell growth.  Having a blood transfusion.  This may be needed if you lose a lot of blood.  Making changes to your diet.  Having surgery to remove your spleen. Follow these instructions at home:  Take over-the-counter and prescription medicines only as told by your health care provider.  Take supplements only as told by your health care provider.  Follow any diet instructions that you were given.  Keep all follow-up visits as told by your health care provider. This is important. Contact a health care provider if:  You develop new bleeding anywhere in the body. Get help right away if:  You are very weak.  You are short of breath.  You have pain in your abdomen or chest.  You are dizzy or feel faint.  You have trouble concentrating.  You have bloody or black, tarry stools.  You vomit repeatedly or you vomit up blood. Summary  Anemia is a condition in which you do not have enough red blood cells or enough of a substance in your red blood cells that carries oxygen (hemoglobin).  Symptoms may occur suddenly or develop slowly.  If your anemia is mild, you may not have symptoms.  This condition is diagnosed with blood tests as well as a medical history and physical exam. Other tests may be needed.  Treatment for this condition depends on the cause of the anemia. This information is not intended to replace advice given to you by your health care provider. Make sure you discuss any questions you have with your health care provider. Document Released: 05/21/2004 Document Revised: 05/15/2016 Document Reviewed: 05/15/2016 Elsevier Interactive Patient Education  2019 [employer] Vitamin D Deficiency Vitamin D deficiency is when your body does not have enough vitamin D. Vitamin D is important to your body for many reasons:  It helps the body to absorb two important minerals, called calcium and phosphorus.  It plays a role in bone health.  It may help to prevent some diseases, such as diabetes and multiple  sclerosis.  It plays a role in muscle function, including heart function. You can get vitamin D by:  Eating foods that naturally contain vitamin D.  Eating or drinking milk or other dairy products that have vitamin D added to them.  Taking a vitamin D supplement or a multivitamin supplement that contains vitamin D.  Being in the sun. Your body naturally makes vitamin D when your skin is exposed to sunlight. Your body changes the sunlight into a form of the vitamin that the body can use. If vitamin D deficiency is severe, it can cause a condition in which your bones become soft. In adults, this condition is called osteomalacia. In children, this condition is called rickets. What are the causes? Vitamin D deficiency may be caused by:  Not eating enough foods that contain vitamin D.  Not getting enough sun exposure.  Having certain digestive system diseases that make it difficult for your body to absorb vitamin D. These diseases include Crohn disease, chronic pancreatitis, and cystic fibrosis.  Having a surgery in which a part of the stomach or a part of the small intestine is removed.  Being obese.  Having chronic kidney disease or liver disease. What increases the risk? This condition is more likely to develop in:  Older people.  People who do not spend much time outdoors.  People who live in a long-term care facility.  People who have had broken bones.  People with weak or thin bones (osteoporosis).  People who have a disease or condition that changes how the body absorbs vitamin D.  People who have dark skin.  People who take certain medicines, such as steroid medicines or certain seizure medicines.  People who are overweight or obese. What are the signs or symptoms? In mild cases of vitamin D deficiency, there may not be any symptoms. If the condition is severe, symptoms may include:  Bone pain.  Muscle pain.  Falling often.  Broken bones caused by a minor  injury. How is this diagnosed? This condition is usually diagnosed with a blood test. How is this treated? Treatment for this condition may depend on what caused the condition. Treatment options include:  Taking vitamin D supplements.  Taking a calcium supplement. Your health care provider will suggest what dose is best for you. Follow these instructions at home:  Take medicines and supplements only as told by your health care provider.  Eat foods that contain vitamin D. Choices include: ? Fortified dairy products, cereals, or juices. Fortified means that vitamin D has been added to the food. Check the label on the package to be sure. ? Fatty fish, such as salmon or trout. ? Eggs. ? Oysters.  Do not use a tanning bed.  Maintain a healthy weight. Lose weight, if needed.  Keep all follow-up visits as told by your health care provider. This is important. Contact a health care provider if:  Your symptoms do not go away.  You feel like throwing up (nausea) or you throw up (vomit).  You have fewer bowel movements than usual or it is difficult for you to have a bowel movement (constipation). This information is not intended to replace advice given to you by your health care provider. Make sure you discuss any questions you have with your health care provider. Document Released: 07/06/2011 Document Revised: 09/25/2015 Document Reviewed: 08/29/2014 Elsevier Interactive Patient Education  2019 [employer] Patellar Tendinitis  Patellar tendinitis, also called jumper's knee, is inflammation of the patellar tendon. Tendons are cord-like tissues that connect muscles to bones. The patellar tendon connects the bottom of the kneecap (patella) to the top of the shin bone (tibia). Patellar tendinitis causes pain in the front of the knee. The condition happens in the following stages:  Stage 1. In this stage, there is pain only after activity.  Stage 2: In this stage, you have pain during and  after activity.  Stage 3: In this stage, you have pain during and after activity that limits your ability to do the activity.  Stage 4: In this stage, the tendon tears and severely limits your activity. What are the causes? This condition is caused by repeated (repetitive) stress on the tendon. This stress may cause the tendon to stretch, swell, thicken, or tear. What increases the risk? The following factors make you more likely to develop this condition:  Participating in sports that involve running, kicking and jumping, especially on hard surfaces. These include: ? Basketball. ? Volleyball. ? Soccer. ? Track and field.  Having tight thigh muscles.  Having received steroid injections in the tendon.  Having had knee surgery.  Being 72-89 years old.  Having rheumatoid arthritis or diabetes.  Training too hard. What are the signs or symptoms? The main symptom of this condition is pain in the front of the knee. The pain usually starts slowly then it gradually gets worse. It may become painful to straighten your leg. How is this diagnosed? This condition may be diagnosed based on:  Your symptoms.  A medical history.  A physical exam. During the physical exam, your health care provider may check for tenderness in your patella, tightness in your thigh muscles, and pain when you straighten your knee.  Imaging tests, including: ? X-rays. These will show the position and condition of your patella. ? MRI. This will show any tears in your tendon. ? Ultrasound. This will show any swelling in your tendon and the thickness of your tendon. How is this treated? Treatment for this condition depends on the stage of the condition. It may involve:  Avoiding activities that cause pain.  Icing your knee.  Taking an NSAID to reduce pain and swelling.  Doing stretching and strengthening exercises (physical therapy) when pain and swelling improve.  Having sound wave stimulation to  promote healing.  Wearing a knee brace. This may be needed if your condition does not improve with treatment.  Using crutches or a walker. This may be needed if your condition does not improve with treatment.  Surgery. This may be done if you have stage 4 tendinitis. Follow these instructions at home: If You Have a Knee Brace:  Wear it as told by your health care provider. Remove it only as told by your health care provider.  Loosen the brace if your toes become numb and tingle, or if they turn cold and blue.  Do not let your brace get wet if it is not waterproof.  Keep the brace clean.  Ask your health care provider when it is safe for you to drive. Managing pain, stiffness, and swelling   Take over-the-counter and prescription medicines only as told by your health care provider.  If directed, apply ice to the injured area. ? Put ice in a plastic bag. ? Place a towel between your skin and the bag. ? Leave the ice on for 20 minutes, 2-3 times a day.  Move your toes often to avoid stiffness and to lessen swelling.  Raise (elevate) your knee above the level of your heart while you are sitting or lying down. Activity  Return to your normal activities as told by your health care provider. Ask your health care provider what activities are safe for you.  Do exercises as told by your health care provider. General instructions  Do not use the injured limb to support your body weight until your health care provider says that you can. Use your crutches or walker as told by your health care provider.  Keep all follow-up visits as told by your health care provider. This is important. How is this prevented?  Warm up and stretch before being active.  Cool down and stretch after being active.  Give your body time to rest between periods of activity.  Make sure to use equipment that fits you.  Be safe and responsible while being active to avoid falls.  Do at least 150 minutes of  moderate-intensity exercise each week, such as brisk walking or water aerobics.  Maintain physical fitness, including: ? Strength. ? Flexibility. ? Cardiovascular fitness. ? Endurance.  Contact a health care provider if:  Your symptoms have not improve in 6 weeks.  Your symptoms get worse. This information is not intended to replace advice given to you by your health care provider. Make sure you discuss any questions you have with your health care provider. Document Released: 04/13/2005 Document Revised: 12/17/2015 Document Reviewed: 01/15/2015 Elsevier Interactive Patient Education  2019 [employer] Patellar Tendinitis Rehab Ask your health care provider which exercises are safe for you. Do exercises exactly as told by your health care provider and adjust them as directed. It is normal to feel mild stretching, pulling, tightness, or discomfort as you do these exercises, but you should stop right away if you feel sudden pain or your pain gets worse.Do not begin these exercises until told by your health care provider. Stretching and range of motion exercises This exercise warms up your muscles and joints and improves the movement and flexibility of your knee. This exercise also helps to relieve pain and stiffness. Exercise A: Hamstring, doorway  1. Lie on your back in front of a doorway with your ____left______ leg resting against the wall and your other leg flat on the floor in the doorway. There should be a slight bend in your __right________ knee. 2. Straighten your __left________ knee. You should feel a stretch behind your knee or thigh. If you do not, scoot your buttocks closer to the door. 3. Hold this position for ______10____ seconds. Repeat ____3______ times. Complete this stretch ___3_______ times a day. Strengthening exercises These exercises build strength and endurance in your knee. Endurance is the ability to use your muscles for a long time, even after they get  tired. Exercise B: Quadriceps, isometric  1. Lie on your back with your _____left_____ leg extended and your other knee bent. 2. Slowly tense the muscles in the front of your ___left_______ thigh. When you do this, you should see your kneecap slide up toward your hip or see increased dimpling just above the knee. This motion will push the back of your knee toward the floor. If this is painful, try putting a rolled-up hand towel under your knee to support it in a bent position. Change the size of the towel to find a position that allows you to do this exercise without any pain. 3. For ____10______ seconds, hold the muscle as tight as you can without increasing your pain. 4. Relax the muscles slowly and completely. Repeat _____3_____ times. Complete this exercise _______3___ times a day. Exercise C: Straight leg raises (quadriceps) 1. Lie on your back with your ____left______ leg extended and your other knee bent. 2. Tense the muscles in the front of your ____left______ thigh. When you do this, you should see your kneecap slide up or see increased dimpling just above the knee. 3. Keep these muscles tight as you raise your leg 4-6 inches (10-15 cm) off the floor. Do not let your moving knee bend. 4. Hold this position for ___10_______ seconds. 5. Keep these muscles tense as you slowly lower your leg. 6. Relax your muscles slowly and completely. Repeat _____3_____ times. Complete this exercise ___3_______ times a day. Exercise D: Squats 1. Stand in front of a table, with your feet and knees pointing straight ahead. You may rest your hands on the table for balance but not for support. 2. Slowly bend your knees and lower your hips like you are going to sit in a chair. ? Keep your weight over your heels, not over your toes. ? Keep your lower  legs upright so they are parallel with the table legs. ? Do not let your hips go lower than your knees. ? Do not bend lower than told by your health care  provider. ? If your knee pain increases, do not bend as low. 3. Hold the squat position for ____10_____ seconds. 4. Slowly push with your legs to return to standing. Do not use your hands to pull yourself to standing. Repeat _____3_____ times. Complete this exercise _____3_____ times a day. Exercise E: Step-downs 1. Stand on the edge of a step. 2. Keeping your weight over your __left________ heel, slowly bend your _____left_____ knee to bring your ___left_______ heel toward the floor. Lower your heel as far as you can while keeping control and without increasing any discomfort. ? Do not let your _____left_____ knee come forward. ? Use your leg muscles, not gravity, to lower your body. ? Hold a wall or rail for balance if needed. 3. Slowly push through your heel to lift your body weight back up. 4. Return to the starting position. Repeat ____3______ times. Complete this exercise ___3______ times a day. Exercise F: Straight leg raises (hip abductors)  1. Lie on your side with your _____left_____ leg in the top position. Lie so your head, shoulder, knee, and hip line up. You may bend your lower knee to help you keep your balance. 2. Roll your hips slightly forward, so that your hips are stacked directly over each other and your ____left______ knee is facing forward. 3. Leading with your heel, lift your top leg 4-6 inches (10-15 cm). You should feel the muscles in your outer hip lifting. ? Do not let your foot drift forward. ? Do not let your knee roll toward the ceiling. 4. Hold this position for ____10______ seconds. 5. Slowly lower your leg to the starting position. 6. Let your muscles relax completely after each repetition. Repeat ___3_______ times. Complete this exercise ___3_______ times a day. This information is not intended to replace advice given to you by your health care provider. Make sure you discuss any questions you have with your health care provider. Document Released:  04/13/2005 Document Revised: 12/19/2015 Document Reviewed: 01/15/2015 Elsevier Interactive Patient Education  2019 [place] Ask your health care provider which exercises are safe for you. Do exercises exactly as told by your health care provider and adjust them as directed. It is normal to feel mild stretching, pulling, tightness, or discomfort as you do these exercises, but you should stop right away if you feel sudden pain or your pain gets worse.Do not begin these exercises until told by your health care provider. Stretching and range of motion exercises These exercises warm up your muscles and joints and improve the movement and flexibility of your knee. These exercises also help to relieve pain, numbness, and tingling. Exercise A: Hamstring, standing  1. Stand with your _____left_____ foot resting on a chair. Your __right________ leg should be fully extended. 2. Arch your lower back slightly. 3. Leading with your chest, lean forward at the waist until you feel a gentle stretch in the back of your ____left______ knee or in your thigh. You should not need to lean far to feel a stretch. 4. Hold this position for __10________ seconds. Repeat _____3_____ times. Complete this stretch ______3____ times a day. Exercise B: Knee flexion, active heel slides 1. Lie on your back with both knees straight. If this causes back discomfort, bend your _____left_____ knee so your foot is flat on the floor.  2. Slowly slide your ___left_______ heel back toward your buttocks until you feel a gentle stretch in the front of your knee or thigh. 3. Hold this position for _____10_____ seconds. 4. Slowly slide your ___left_______ heel back to the starting position. Repeat ______3____ times. Complete this stretch ____3______ times a day. Strengthening exercises These exercises build strength and endurance in your knee. Endurance is the ability to use your muscles for a long time, even  after they get tired. Exercise C: Quadriceps, isometric  1. Lie on your back with your _____left_____ leg extended and your _____right_____ knee bent. 2. Slowly tense the muscles in the front of your __left________ thigh. When you do this, you should see your kneecap slide up toward your hip or see increased dimpling just above the knee. This motion will push the back of your knee down toward the surface that is under it. 3. For ____10______ seconds, keep the muscle as tight as you can without increasing your pain. Relax the muscle and repeat 3____ times. Complete this exercise ____3______ times a day. Exercise D: Straight leg raises (quadriceps) 1. Lie on your back with your _____left_____ leg extended and your ___right_______ knee bent. 2. Slowly tense the muscles in your _____left_____ thigh. When you do this, you should see your kneecap slide up toward your hip or see increased dimpling just above the knee. 3. Keep these muscles tight as you raise your leg 4-6 inches (10-15 cm) off the floor. 4. Hold this position for _____10_____ seconds. 5. Keep these muscles tense as you lower your leg slowly. 6. Relax your muscles slowly and completely. Repeat ____3______ times. Complete this exercise _______3___ times a day. Exercise E: Straight leg raises (hip extensors) 1. Lie on your abdomen on a bed or a firm surface. You can put a pillow under your hips if that is more comfortable. 2. Tense your buttock muscles and lift your ___left_______ thigh. Your _____left_____ knee can be bent or straight, but do not let your back arch. 3. Hold this position for ____10______ seconds. 4. Slowly lower your leg to the starting position. 5. Let your muscles relax completely. Repeat _____3_____ times. Complete this exercise ____3______ times a day. This information is not intended to replace advice given to you by your health care provider. Make sure you discuss any questions you have with your health care  provider. Document Released: 04/13/2005 Document Revised: 12/17/2015 Document Reviewed: 01/11/2015 Elsevier Interactive Patient Education  2019 [place] Bursitis  Prepatellar bursitis is inflammation of the prepatellar bursa. The prepatellar bursa is a fluid-filled sac that cushions the kneecap (patella). Prepatellar bursitis happens when fluid builds up in the this sac and causes it to get larger. The condition causes knee pain. What are the causes? This condition may be caused by:  Constant pressure on the knees from kneeling.  A hit to the knee.  Falling on the knee.  A bacterial infection.  Moving the knee often in a forceful way. What increases the risk? This condition is more likely to develop in:  People who play a sport that involves a risk for falls on the knee or hard hits (blows) to the knee. These sports include: ? Football. ? Wrestling. ? Basketball. ? Soccer.  People who have to kneel for long periods of time, such as roofers, plumbers, and gardeners.  People with another inflammatory condition, such as gout or rheumatoid arthritis. What are the signs or symptoms? The most common symptom of this condition is knee pain that  gets better with rest. Other symptoms include:  Swelling on the front of the kneecap.  Warmth in the knee.  Tenderness with activity.  Redness in the knee.  Inability to bend the knee or to kneel. How is this diagnosed? This condition may be diagnosed based on:  Your symptoms.  Your medical history.  A physical exam. During the exam, your provider will compare your knees and check for tenderness and pain when moving your knee. Your health care provider may also use a needle to remove fluid from the bursa to help diagnose an infection.  Tests, such as: ? A blood test that checks for infection. ? X-rays. These may be taken to check the structure of the patella. ? MRI or ultrasound. These may be done to check for  swelling and fluid buildup in the bursa. How is this treated? This condition may be treated by:  Resting the knee.  Applying ice to the knee.  Medicine, such as: ? Nonsteroidal anti-inflammatory drugs (NSAIDs). These can help to reduce pain and swelling. ? Antibiotic medicines. These may be needed if you have an infection. ? Steroid medicines. These may be prescribed if other treatments are not helping.  Raising (elevating) the knee while resting.  Doing strengthening and stretching exercises (physical therapy). These may be recommended after pain and swelling improve.  Having a procedure to remove fluid from the bursa. This may be done if other treatment is not helping.  Having surgery to remove the bursa. This may be done if you have a severe infection or if the condition keeps coming back after treatment. Follow these instructions at home: Medicines  Take over-the-counter and prescription medicines only as told by your health care provider.  If you were prescribed an antibiotic medicine, take it as told by your health care provider. Do not stop taking the antibiotic even if you start to feel better. Managing pain, stiffness, and swelling   If directed, apply ice to your knee. ? Put ice in a plastic bag. ? Place a towel between your skin and the bag. ? Leave the ice on for 20 minutes, 2-3 times a day.  Elevate your knee above the level of your heart while you are sitting or lying down. Driving  Do not drive or operate heavy machinery while taking prescription pain medicine.  Ask your health care provider when it is safe fpr you to drive. Activity  Rest your knee.  Avoid activities that cause pain.  Return to your normal activities as told by your health care provider. Ask your health care provider what activities are safe for you.  Do exercises as told by your health care provider. General instructions  Do not use the injured limb to support your body weight until  your health care provider says that you can.  Do not use any tobacco products, such as cigarettes, chewing tobacco, and e-cigarettes. Tobacco can delay bone healing. If you need help quitting, ask your health care provider.  Keep all follow-up visits as told by your health care provider. This is important. How is this prevented?  Warm up and stretch before being active.  Cool down and stretch after being active.  Give your body time to rest between periods of activity.  Make sure to use equipment that fits you.  Be safe and responsible while being active to avoid falls.  Do at least 150 minutes of moderate-intensity exercise each week, such as brisk walking or water aerobics.  Maintain physical fitness, including: ?  Strength. ? Flexibility. ? Cardiovascular fitness. ? Endurance. Contact a health care provider if:  Your symptoms do not improve.  Your symptoms get worse.  Your symptoms keep coming back after treatment.  You develop a fever and have warmth, redness, and swelling over your knee. This information is not intended to replace advice given to you by your health care provider. Make sure you discuss any questions you have with your health care provider. Document Released: 04/13/2005 Document Revised: 06/04/2017 Document Reviewed: 01/11/2015 Elsevier Interactive Patient Education  2019 [employer].

## 2018-07-19 NOTE — Progress Notes (Signed)
Subjective:    Patient ID: Dawn Trevino, female    DOB: 01-Nov-1965, 53 y.o.   MRN: [id number]  52y/o african [place] female established here for lab results [facility] and new left knee pain and swelling.  Had started new exercises classes at church zumba and another weights/aerobics that is intense but stopped intense class two weeks ago due to knee pain and swelling.   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  Has noted popping denied giving out or locking up.  Thinks left knee has been swelling.  Has tried motrin once a day on worst days and rest prn.  Hasn't tried ice yet or wrapping/knee support.  Has been trying to lose weight.     Review of Systems  Constitutional: Positive for activity change. Negative for appetite change, chills, diaphoresis, fatigue and fever.  HENT: Negative for trouble swallowing and voice change.   Eyes: Negative for photophobia and visual disturbance.  Respiratory: Negative for cough.   Cardiovascular: Negative for chest pain.  Gastrointestinal: Negative for abdominal pain, diarrhea, nausea and vomiting.  Endocrine: Negative for cold intolerance and heat intolerance.  Genitourinary: Negative for difficulty urinating.  Musculoskeletal: Positive for arthralgias and joint swelling. Negative for back pain, gait problem, myalgias, neck pain and neck stiffness.  Skin: Negative for color change, pallor, rash and wound.  Allergic/Immunologic: Negative for food allergies.  Neurological: Negative for dizziness, tremors, seizures, syncope, facial asymmetry, speech difficulty, weakness, light-headedness, numbness and headaches.  Hematological: Negative for adenopathy. Does not bruise/bleed easily.  Psychiatric/Behavioral: Negative for agitation, behavioral problems, confusion and sleep disturbance.       Objective:   Physical Exam Vitals signs reviewed.   Constitutional:      General: She is not in acute distress.    Appearance: Normal appearance. She is well-developed. She is not ill-appearing, toxic-appearing or diaphoretic.  HENT:     Head: Normocephalic and atraumatic.     Right Ear: External ear normal.     Left Ear: External ear normal.     Nose: No congestion or rhinorrhea.     Mouth/Throat:     Mouth: Mucous membranes are moist.     Pharynx: No oropharyngeal exudate or posterior oropharyngeal erythema.  Eyes:     General: Lids are normal. No visual field deficit.       Right eye: No discharge.        Left eye: No discharge.     Extraocular Movements: Extraocular movements intact.     Conjunctiva/sclera: Conjunctivae normal.     Pupils: Pupils are equal, round, and reactive to light.  Neck:     Musculoskeletal: Normal range of motion and neck supple. No neck rigidity or muscular tenderness.     Trachea: Trachea normal.  Cardiovascular:     Rate and Rhythm: Normal rate and regular rhythm.     Pulses: Normal pulses.     Heart sounds: Normal heart sounds.  Pulmonary:     Effort: Pulmonary effort is normal. No respiratory distress.     Breath sounds: Normal breath sounds. No stridor. No wheezing, rhonchi or rales.  Abdominal:     General: Abdomen is flat.     Palpations: Abdomen is soft.  Musculoskeletal: Normal range of motion.        General: Swelling and tenderness present. No deformity.     Right shoulder: Normal.     Left shoulder: Normal.  Right elbow: Normal.    Left elbow: Normal.     Right hip: Normal.     Left hip: Normal.     Right knee: She exhibits normal range of motion, no swelling, no effusion, no ecchymosis, no deformity, no laceration, no erythema, normal alignment, no LCL laxity, normal patellar mobility, no bony tenderness, normal meniscus and no MCL laxity. Tenderness found. Patellar tendon tenderness noted. No medial joint line, no lateral joint line, no MCL and no LCL tenderness noted.     Left  knee: She exhibits swelling, effusion and erythema. She exhibits normal range of motion, no ecchymosis, no deformity, no laceration, normal alignment, no LCL laxity, normal patellar mobility, no bony tenderness, normal meniscus and no MCL laxity. Tenderness found. Patellar tendon tenderness noted. No medial joint line, no lateral joint line, no MCL and no LCL tenderness noted.     Right ankle: Normal.     Left ankle: Normal.     Cervical back: Normal.     Thoracic back: Normal.     Lumbar back: Normal.     Right hand: Normal.     Left hand: Normal.     Right lower leg: No edema.     Left lower leg: No edema.       Legs:     Comments: Bilateral crepitus with flexion/extension knees; negative mcmurray's/lachmann's/valgus/varus stress/anterior/posterior drawer sign; posterior fossa not TTP bilaterally neither are joint lines medial or lateral  Lymphadenopathy:     Head:     Right side of head: No submental, submandibular or preauricular adenopathy.     Left side of head: No submental, submandibular or preauricular adenopathy.     Cervical: No cervical adenopathy.  Skin:    General: Skin is warm and dry.     Capillary Refill: Capillary refill takes less than 2 seconds.     Coloration: Skin is not ashen, cyanotic, jaundiced, mottled, pale or sallow.     Findings: No abrasion, abscess, acne, bruising, burn, ecchymosis, erythema, laceration, lesion, petechiae, rash or wound.     Nails: There is no clubbing.   Neurological:     General: No focal deficit present.     Mental Status: She is alert and oriented to person, place, and time. Mental status is at baseline.     GCS: GCS eye subscore is 4. GCS verbal subscore is 5. GCS motor subscore is 6.     Cranial Nerves: Cranial nerves are intact. No cranial nerve deficit, dysarthria or facial asymmetry.     Sensory: Sensation is intact. No sensory deficit.     Motor: Motor function is intact. No weakness, tremor, atrophy, abnormal muscle tone or  seizure activity.     Coordination: Coordination is intact. Coordination normal.     Gait: Gait is intact. Gait normal.     Comments: Gait sure and steady hallway; no limping observed; on/off exam table and in/out of chair without difficulty; upper and lower extremity strength 5/5 bilaterally  Psychiatric:        Attention and Perception: Attention and perception normal.        Mood and Affect: Mood and affect normal.        Speech: Speech normal.        Behavior: Behavior normal.        Thought Content: Thought content normal.        Cognition and Memory: Cognition and memory normal.        Judgment: Judgment normal.    Fitted  and distributed left knee sleeve from clinic stock diameter 18 inches left knee; assisted patient with application and she reported felt comfortable after starting to wear. Discussed to wear at home and work may remove to shower/sleep.  Patient verbalized understanding information/instructions   See lab results note discussed with patient in clinic and follow up with [facility] scheduled for May 2020.  RN Hildred Alamin had reviewed results  with patient prior to NP appt and given handout see her note.  I reinforced Vitamin D oral and sunshine recommendations with patient along with multivitamin daily.  PMHx thyroid nodule hypothyroidism TSH low on this check was midrange last year to follow up with PCM as may require levothyroxine dose change with continued weight loss efforts.  Exitcare handout hyperthyroidism, vitamin D deficiency, mediterranean/dash diet and managing blood pressure.  Patient verbalized understanding information/instructions and had no further questions at this time.    Assessment & Plan:  A-bilateral patellar tendonitis and left prepatellar bursitis acute initial visit, chronic anemia, vitamin D Deficiency, hypothyroidism acquired, overweight  P-Wear left knee neoprene sleeve for support at work/home/during exercise.  Avoid weight gain and continue efforts to lose  weight. Discussed may do seated weights upper body, bicycle no resistance, yoga/tai chi, zumba low impact and stretching/core exercises. Avoid high impact exercises/classes x 2 weeks.  Alternate knee on ground when kneeling and use her kneeling pad at work/home. Patient was instructed to rest, ice and elevate leg when resting. Medications as directed. Start ibuprofen 861m po TID with food x 2 weeks if stomach irritation decrease to BID. May alternate with tylenol 10078mpo q6h prn pain.  Given 1 chemical ice pack for use at work today and given reusable ice pack for use at home from clinic stock elevate left leg and apply 15 minutes cryotherapy at least daily. Call or return to clinic as needed if these symptoms worsen or fail to improve as anticipated. Trial home exercise program [facility] tendonitis and bursitis printed and given to patient TID 3 reps each exercise.  Avoid the squats at this time.  Trial biofreeze topical gel 4% QID prn pain given 4 UD from clinic stock.  Patient verbalized agreement and understanding of treatment plan and had no further questions at this time.  Exitcare handout on knee sprain and rehab exercises given to patient.  Patient with vaginal fibroids sees gynecology annually on oral birth control and multivitamin.  Anemia improving. Exitcare handout anemia.  Left thyroid nodule on levothyroxine TSH low today [facility] HaHildred Alamin/24/2020 may require dose adjustment with her weight loss efforts.  Consider repeat TSH in 8 weeks.  Exitcare handout on hyperthyroidism.  Overweight has been trying to lose weight adjusting exercise and trying new group classes.  Exitcare handouts dash/mediterranean diets/exercise for weight loss.  Vitamin D deficiency chronic decreased sunlight per day due to cold weather and stopped single D3 supplement and only taking multivitamin over the winter months.  Will increase  time outside with warmer weather spring days and restart D3 supplement 1,000-2,000 international units until warm weather consistent and follow up with PCM.  Take Vitamin D with fattiest meal of the day.  Exitcare handout Vitamin D deficiency.  Patient verbalized understanding information/instructions, agreed with plan of care and had no further questions at this time.  Borderline elevated blood pressures at [facility] and today's appt but with knee pain and swelling left could be contributing.  Overweight.  Increased stress at work and home due to [employer] restrictions.  Encouraged continue exercising as able/healthy diet/7-8 hours sleep per night.  Follow up with PCM.  Continue weight loss efforts.  Exitcare handouts exercise/dash and mediterranean diet P2: ROM exercises, Stretching, and Hand out given

## 2018-07-20 NOTE — Progress Notes (Signed)
noted 

## 2018-07-21 NOTE — Progress Notes (Signed)
Pt given updated AVS in clinic this morning that included all new handouts added by NP as were requested above. She sts she has not heard back from her MD office yet re: low TSH and Vit D. Advised her she could call tomorrow if still no response to check recommendations for continuing with May 2020 appt or if it needed to be moved earlier. Pt verbalizes understanding and agreement. No further questions/concerns.

## 2018-07-21 NOTE — Progress Notes (Signed)
noted 

## 2018-07-25 ENCOUNTER — Telehealth: Payer: Self-pay | Admitting: Obstetrics and Gynecology

## 2018-07-25 NOTE — Telephone Encounter (Signed)
-----   Message from Beckie Busing, RN sent at 07/19/2018 10:40 AM EDT ----- Please see attached for Dawn Trevino's annual occ health labs drawn for insurance purposes. These have been reviewed with the patient in our clinic.  Please note TSH and Vitamin D levels. Pt's next scheduled appointment is not until 09/09/18.   Please contact Dawn Trevino with any recommendations or concerns.  Thank you! Cheri Guppy BSN, Psychologist, clinical, [employer]. [facility] p. [phone] ext. [phone] f.  [phone]

## 2018-07-25 NOTE — Telephone Encounter (Signed)
This is Dr. Quincy Simmonds covering for Evalee Mutton who is out of the office.   I have reviewed the patient's labs drawn at her work.  Results are visible in Epic.  Her vitamin D level is low.  I agree with her practitioner recommending vit D 1000 - 2000 IU daily.   Her TSH is slightly low and her free T4 is normal.  She can continue on her current Synthroid dosage.   She has an appointment to follow up with Jackelyn Poling in May for her annual exam.

## 2018-08-05 NOTE — Telephone Encounter (Signed)
Return call from patient. Reviewed lab results and instructions with patient. Reports feeling sluggish and some concerns about thyroid function.   Patient states she stopped birth control pills after missing cycles.  States previously discussed with Debbie testing for menopause.  Annual exam on 09-09-2018. If cancelled due to Covid 19, advised can discuss thyroid and menses/meopausal issues through [facility] visit.  Since has already been off contraceptive pills since February, should not restart till reviewing with provider.   Routing to Dr Quincy Simmonds for final review. Encounter closed.

## 2018-08-05 NOTE — Telephone Encounter (Signed)
Call to patient. Per ROI, can leave message on voice mail/  Left message calling to follow-up on labs from work. Call back at her convenience, no urgency. Can speak to triage nurse.

## 2018-09-09 ENCOUNTER — Ambulatory Visit: Payer: PRIVATE HEALTH INSURANCE | Admitting: Certified Nurse Midwife

## 2018-09-27 ENCOUNTER — Other Ambulatory Visit: Payer: Self-pay | Admitting: Certified Nurse Midwife

## 2018-09-27 DIAGNOSIS — E039 Hypothyroidism, unspecified: Secondary | ICD-10-CM

## 2018-09-27 NOTE — Telephone Encounter (Signed)
One month with one refill sent, she needs to come in for a TSH prior to further refills. She should be on the medication for at least 4 weeks prior to checking a TSH

## 2018-09-27 NOTE — Telephone Encounter (Signed)
Patient is aware. Melvia Heaps, will look at her labwork that she had done at work in march at her aex.

## 2018-09-27 NOTE — Telephone Encounter (Signed)
Medication refill request: synthroid 93mcg Last AEX:  09-07-2017 Next AEX: 10-18-2018 Last MMG (if hormonal medication request): n/a Refill authorized: please approve if appropriate

## 2018-09-27 NOTE — Telephone Encounter (Signed)
Patient is calling regarding needing refill of synthroid. She has been out of her medication and asked if this could be called in today. [facility], [place].  She scheduled her AEX 10/18/18 with Melvia Heaps.

## 2018-10-03 ENCOUNTER — Other Ambulatory Visit: Payer: Self-pay | Admitting: *Deleted

## 2018-10-03 NOTE — Telephone Encounter (Signed)
Yes. Dawn Trevino is a patient of Regina [facility]. We fill medications at the onsite dispensary at [employer] employer. But have to have an active prescription on file from the employees primary or specialist in order to dispense the medication, at no cost to the employee.

## 2018-10-03 NOTE — Telephone Encounter (Signed)
Good evening, was this message intended for [facility]?

## 2018-10-03 NOTE — Telephone Encounter (Signed)
New Rx for Ibuprofen 800mg  needed to continue filling through workplace dispensary at no cost to pt. Pt takes for headaches.   If appropriate, please place order with [facility] as preferred pharmacy. Rx will default to "Print" from e-Rx due to restrictions within Epic/CHL. Printed Rx can be shredded as clinic can view new Rx in CHL as needed.  Please let me know if you have any questions or concerns.

## 2018-10-04 MED ORDER — IBUPROFEN 800 MG PO TABS: 800.0000 mg | ORAL_TABLET | Freq: Three times a day (TID) | ORAL | 0 refills | Status: DC | PRN

## 2018-10-04 NOTE — Telephone Encounter (Signed)
Patient takes Ibuprofen for headaches.

## 2018-10-04 NOTE — Telephone Encounter (Signed)
Medication refill request: Ibuprofen  Last AEX:  09/07/17 DL Next AEX: 10/18/18 Last MMG (if hormonal medication request): 06/27/18 Diagnostic MM/US of Right Breast -- BIRADS 3 probably benign/density c  Refill authorized: Please advise; patient is requesting refill; Order pended #30 w/0 refills if approved to be sent to [facility]

## 2018-10-04 NOTE — Telephone Encounter (Signed)
Lovena Le, I just spoke with Maudie Mercury. I know this is an unusual set up. 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 We do not manage long term Rxs or chronic illnesses as there is only myself and a NP here for 400+ employees. So we ask that primary care manage these chronic issues and we simply provide the prescribed meds to the pt at no cost. This is why we need the prescription, to know that primary care agrees with the plan of care and the medications and we aren't stepping on anyone's toes.   So I am asking that if Neoma Laming approves the refill, you put the prescription in and change the pharmacy to "[facility] and that way it doesn't go to [facility]. It will print by default since we are not a full licensed pharmacy vs dispensary, but you can shred that as I will just view the new order here in [place].   Or if you prefer, let me know that Neoma Laming approves it, and I will place the order for you, under Neoma Laming.

## 2018-10-04 NOTE — Telephone Encounter (Signed)
What is she taking this for?

## 2018-10-04 NOTE — Telephone Encounter (Signed)
Spoke with patient to confirm that the prescription was requested and where it needs to be sent. Patient states that she will contact Beckie Busing, RN to find out where prescription has to be sent and give our office a call back.

## 2018-10-05 NOTE — Telephone Encounter (Signed)
Prescription sent to [facility] in error. Okay to fill prescription for patient.

## 2018-10-06 NOTE — Telephone Encounter (Signed)
No worries. I'll pull that Rx for our records and have let pt know to just not pick up from [facility] as to avoid cost. Thank you.

## 2018-10-18 ENCOUNTER — Ambulatory Visit (INDEPENDENT_AMBULATORY_CARE_PROVIDER_SITE_OTHER): Payer: PRIVATE HEALTH INSURANCE | Admitting: Certified Nurse Midwife

## 2018-10-18 ENCOUNTER — Encounter: Payer: Self-pay | Admitting: Certified Nurse Midwife

## 2018-10-18 ENCOUNTER — Other Ambulatory Visit: Payer: Self-pay

## 2018-10-18 ENCOUNTER — Other Ambulatory Visit (HOSPITAL_COMMUNITY)
Admission: RE | Admit: 2018-10-18 | Discharge: 2018-10-18 | Disposition: A | Discharge status: Home / Self Care | Payer: PRIVATE HEALTH INSURANCE | Source: Ambulatory Visit | Attending: Certified Nurse Midwife | Admitting: Certified Nurse Midwife

## 2018-10-18 VITALS — BP 110/64 | HR 68 | Temp 97.7°F | Resp 16 | Ht 63.5 in | Wt 179.0 lb

## 2018-10-18 DIAGNOSIS — Z124 Encounter for screening for malignant neoplasm of cervix: Secondary | ICD-10-CM | POA: Insufficient documentation

## 2018-10-18 DIAGNOSIS — Z01419 Encounter for gynecological examination (general) (routine) without abnormal findings: Secondary | ICD-10-CM | POA: Diagnosis not present

## 2018-10-18 DIAGNOSIS — Z3202 Encounter for pregnancy test, result negative: Secondary | ICD-10-CM | POA: Diagnosis not present

## 2018-10-18 DIAGNOSIS — N912 Amenorrhea, unspecified: Secondary | ICD-10-CM

## 2018-10-18 DIAGNOSIS — E039 Hypothyroidism, unspecified: Secondary | ICD-10-CM

## 2018-10-18 LAB — POCT URINE PREGNANCY: Preg Test, Ur: NEGATIVE

## 2018-10-18 NOTE — Progress Notes (Signed)
53 y.o. G1P0001 Married  African American Fe here for annual exam. No periods since 04/2018 (seemed normal). Denies hot flashes or night sweats. Stopped OCP in February with no bleeding on cessation. She feels she is menopausal. Sees PCP prn. Desires labs today. No other health issues today.   No LMP recorded. (Menstrual status: Other).          Sexually active: Yes.    The current method of family planning is none.    Exercising: No.  exercise Smoker:  no  Review of Systems  Constitutional:       No cycle since January 2020  HENT: Negative.   Eyes: Negative.   Respiratory: Negative.   Cardiovascular: Negative.   Gastrointestinal: Negative.   Genitourinary: Negative.   Musculoskeletal: Negative.   Skin: Negative.   Neurological: Negative.   Endo/Heme/Allergies: Negative.   Psychiatric/Behavioral: Negative.     Health Maintenance: Pap:  08-27-15 neg HPV HR neg History of Abnormal Pap: yes MMG:  Bilateral & rt breast 2019, rt breast 06/2018 category c birads 3: prob benign Self Breast exams: yes Colonoscopy:  2016 f/u 39yrs BMD:   none TDaP:  2013 Shingles: no Pneumonia: no Hep C and HIV: HIV neg 2017, Hep c neg 2018 Labs: upt-neg   reports that she has never smoked. She has never used smokeless tobacco. She reports current alcohol use. She reports that she does not use drugs.  Past Medical History:  Diagnosis Date  . Abnormal Pap smear of cervix    30 yrs ago  . Anemia   . Bacterial vaginosis   . Hematuria   . Infertility, female   . Thyroid nodule    LEFT 7/09    Past Surgical History:  Procedure Laterality Date  . CRYOTHERAPY     for abnormal pap smear 34yrs ago  . GYNECOLOGIC CRYOSURGERY  1987   abnormal pap - normal since  . THYROID LOBECTOMY Right 2/95    Current Outpatient Medications  Medication Sig Dispense Refill  . ibuprofen (ADVIL) 800 MG tablet Take 1 tablet (800 mg total) by mouth every 8 (eight) hours as needed. 30 tablet 0  . levothyroxine  (SYNTHROID) 75 MCG tablet TAKE 1 TABLET(75 MCG) BY MOUTH DAILY BEFORE BREAKFAST 30 tablet 1  . loratadine (CLARITIN) 10 MG tablet Take 1 tablet (10 mg total) by mouth daily. 30 tablet 11  . Multiple Vitamin (MULTI-VITAMIN PO) Take by mouth daily.    . Probiotic Product (PROBIOTIC PO) Take by mouth.     No current facility-administered medications for this visit.     Family History  Problem Relation Age of Onset  . Ovarian cancer Mother 24       ovarian cancer  . Diabetes Mother   . Prostate cancer Father 71       prostate cancer  . Liver cancer Brother 3       liver cancer  . Kidney disease Sister        both kidneys removed  . Prostate cancer Brother     ROS:  Pertinent items are noted in HPI.  Otherwise, a comprehensive ROS was negative.  Exam:   BP 110/64   Pulse 68   Temp 97.7 F (36.5 C) (Skin)   Resp 16   Ht 5' 3.5" (1.613 m)   Wt 179 lb (81.2 kg)   BMI 31.21 kg/m  Height: 5' 3.5" (161.3 cm) Ht Readings from Last 3 Encounters:  10/18/18 5' 3.5" (1.613 m)  07/18/18 5' 3.5" (  1.613 m)  09/23/17 5' 3.25" (1.607 m)    General appearance: alert, cooperative and appears stated age Head: Normocephalic, without obvious abnormality, atraumatic Neck: no adenopathy, supple, symmetrical, trachea midline and thyroid normal to inspection and palpation Lungs: clear to auscultation bilaterally Breasts: normal appearance, no masses or tenderness, No nipple retraction or dimpling, No nipple discharge or bleeding, No axillary or supraclavicular adenopathy Heart: regular rate and rhythm Abdomen: soft, non-tender; no masses,  no organomegaly Extremities: extremities normal, atraumatic, no cyanosis or edema Skin: Skin color, texture, turgor normal. No rashes or lesions Lymph nodes: Cervical, supraclavicular, and axillary nodes normal. No abnormal inguinal nodes palpated Neurologic: Grossly normal   Pelvic: External genitalia:  no lesions              Urethra:  normal appearing  urethra with no masses, tenderness or lesions              Bartholin's and Skene's: normal                 Vagina: normal appearing vagina with normal color and discharge, no lesions              Cervix: no cervical motion tenderness, no lesions and normal appearance              Pap taken: Yes.   Bimanual Exam:  Uterus:  enlarged, 10-12 weeks size history of known fibroids, no size change from previous exam weeks size              Adnexa: normal adnexa and no mass, fullness, tenderness               Rectovaginal: Confirms               Anus:  normal sphincter tone, no lesions  Chaperone present: yes  A:  Well Woman with normal exam  Contraception none UPT negative today  Perimenopausal with amenorrhea  Enlarged uterus with known fibroids no size change  Hypothyroid on stable medication    P:   Reviewed health and wellness pertinent to exam  Discussed perimenopause/menopause and expectations with amenorrhea and symptoms. Given printed information for reference. Discussed evaluating with labs today due to thyroid, pituitary can also cause amenorrhea. Questions addressed. Discussed may need Provera challenge depending on lab information.  Labs: FSH,TSH, Prolactin  Discussed no change in uterine size with fibroids and will continue to monitor. Generally decrease in size with menopause.  Will renew Rx for Synthroid once labs in.  Labs were done at her employment for wellness, all normal per patient.  Pap smear: yes   counseled on breast self exam, mammography screening, feminine hygiene, menopause, adequate intake of calcium and vitamin D, diet and exercise  return annually or prn  An After Visit Summary was printed and given to the patient.

## 2018-10-19 LAB — FOLLICLE STIMULATING HORMONE: FSH: 6.7 m[IU]/mL

## 2018-10-19 LAB — PROLACTIN: Prolactin: 21.4 ng/mL (ref 4.8–23.3)

## 2018-10-19 LAB — TSH: TSH: 0.959 u[IU]/mL (ref 0.450–4.500)

## 2018-10-20 LAB — CYTOLOGY - PAP
Adequacy: ABSENT
Diagnosis: NEGATIVE
HPV: NOT DETECTED

## 2018-10-21 ENCOUNTER — Telehealth: Payer: Self-pay

## 2018-10-21 MED ORDER — MEDROXYPROGESTERONE ACETATE 10 MG PO TABS: 10.0000 mg | ORAL_TABLET | Freq: Every day | ORAL | 0 refills | Status: DC

## 2018-10-21 NOTE — Telephone Encounter (Signed)
Spoke with patient regarding lab results.(Dawn Trevino's note would note pull over to phone note--see result note).  All lab results reviewed with patient from 10-18-18. Patient notified of pap results--02 recall--AEX 10-24-19. Since no cycle in 3-4 months patient needs to do Provera challenge. Provera 10mg  x 10 days, 1po qd, #10 NR sent to [facility].Patient knows to call 2 weeks after last dose of Provera with update of bleeding or no bleeding.

## 2018-10-21 NOTE — Telephone Encounter (Signed)
Opened in error

## 2018-10-31 ENCOUNTER — Other Ambulatory Visit: Payer: Self-pay | Admitting: Certified Nurse Midwife

## 2018-11-15 ENCOUNTER — Telehealth: Payer: Self-pay | Admitting: Certified Nurse Midwife

## 2018-11-15 NOTE — Telephone Encounter (Signed)
Spoke with patient. Calling to provide provera update. Reports bleeding started on day 6 of medication, 10/29/18. Bleeding was heavy, experienced cramps and lasted for 6 days.   Advised patient I will update Melvia Heaps, CNM and return call, patient agreeable.   Melvia Heaps, CNM -please advise.

## 2018-11-15 NOTE — Telephone Encounter (Signed)
Patient says she was told to call back after taking medication.

## 2018-11-15 NOTE — Telephone Encounter (Signed)
Spoke with patient, advised as seen below per Melvia Heaps, CNM. Patient verbalizes understanding and is agreeable.   Encounter closed.

## 2018-11-15 NOTE — Telephone Encounter (Signed)
Due to [facility] being low, needs to monitor periods if no period in 3 months needs to advise. May need be on Micronor for prevention of hyperplasia

## 2018-12-01 ENCOUNTER — Other Ambulatory Visit: Payer: Self-pay | Admitting: *Deleted

## 2018-12-01 DIAGNOSIS — E039 Hypothyroidism, unspecified: Secondary | ICD-10-CM

## 2018-12-01 NOTE — Telephone Encounter (Signed)
Medication refill request: Levothyroxine 75 mcg Last AEX:  10-18-2018 DL  Next AEX: 10-24-19  Last MMG (if hormonal medication request): n/a Refill authorized: Today, please advise.   Medication pended for #30, 1 RF. Please refill if appropriate.

## 2018-12-02 MED ORDER — LEVOTHYROXINE SODIUM 75 MCG PO TABS: 75.0000 ug | ORAL_TABLET | Freq: Every day | ORAL | 11 refills | Status: DC

## 2018-12-29 ENCOUNTER — Other Ambulatory Visit: Payer: Self-pay

## 2018-12-29 ENCOUNTER — Ambulatory Visit
Admission: RE | Admit: 2018-12-29 | Discharge: 2018-12-29 | Disposition: A | Discharge status: Home / Self Care | Payer: No Typology Code available for payment source | Source: Ambulatory Visit | Attending: Certified Nurse Midwife | Admitting: Certified Nurse Midwife

## 2018-12-29 ENCOUNTER — Ambulatory Visit
Admission: RE | Admit: 2018-12-29 | Discharge: 2018-12-29 | Disposition: A | Discharge status: Home / Self Care | Payer: PRIVATE HEALTH INSURANCE | Source: Ambulatory Visit | Attending: Certified Nurse Midwife | Admitting: Certified Nurse Midwife

## 2018-12-29 DIAGNOSIS — N63 Unspecified lump in unspecified breast: Secondary | ICD-10-CM

## 2019-01-19 ENCOUNTER — Encounter: Payer: Self-pay | Admitting: Registered Nurse

## 2019-01-19 ENCOUNTER — Telehealth: Payer: Self-pay | Admitting: Registered Nurse

## 2019-01-19 DIAGNOSIS — N951 Menopausal and female climacteric states: Secondary | ICD-10-CM

## 2019-01-19 DIAGNOSIS — N912 Amenorrhea, unspecified: Secondary | ICD-10-CM

## 2019-01-19 NOTE — Telephone Encounter (Signed)
I don't know what to do I haven't had a period since taking the medicine they gave me in June.  Reviewed chart her GYN provider noted [facility] low; patient to monitor periods and if none in 3 months CNM Hollice Espy stated to consider micronor Rx to avoid uterine hyperplasia.  Notified patient of above and to call [facility] office to see if she would right her Rx for micronor or if she needed to schedule appt. Patient verbalized understanding information/instructions, agreed with plan of care and had no further questions at this time.

## 2019-01-26 ENCOUNTER — Telehealth: Payer: Self-pay | Admitting: Certified Nurse Midwife

## 2019-01-26 NOTE — Telephone Encounter (Signed)
Patient was told to call if she did not have a cycle in three months.

## 2019-01-26 NOTE — Telephone Encounter (Signed)
Spoke with patient.   1. Last FSH 6.7 on 10/18/18. LMP 10/29/18 after taking provera. No menses since. Denies any new symptoms. Per review of 11/15/18 telephone encounter advise if no menses in 3 months, may need Micronor for prevention of hyperplasia.  2. Patient request refill of Motrin 800 mg. Patient states she like to keep "medication on hand", she is on her feet, standing on concrete floors.   Confirmed pharmacy on file. Advised I will forward for Melvia Heaps, CNM to review on 10/2, our office will return call with recommendations. Patient agreeable.   Routing to [employer], CNM

## 2019-01-27 MED ORDER — IBUPROFEN 800 MG PO TABS: 800.0000 mg | ORAL_TABLET | Freq: Three times a day (TID) | ORAL | 0 refills | Status: DC | PRN

## 2019-01-27 MED ORDER — NORETHINDRONE 0.35 MG PO TABS: 1.0000 | ORAL_TABLET | Freq: Every day | ORAL | 0 refills | Status: DC

## 2019-01-27 NOTE — Telephone Encounter (Signed)
Spoke with patient. Advised per Melvia Heaps, CNM. Rx for Micronor to verified pharmacy. Patient states she has Motrin filled through employer, Rx printed, patient will call if she needs this Rx faxed. Patient verbalizes understanding and is agreeable.   Encounter closed.

## 2019-01-27 NOTE — Telephone Encounter (Signed)
Needs to continue to keep up with menses. She can start on Micronor, but needs to do UPT first, if negative start on Micronor daily . She may or may not have periods but this helps prevent heavy bleeding episode and hyperplasia during this time of transition. Keep menses calendar and advise status in 3 months. Ok to do Motrin limited amount due to stomach irritation possibility.

## 2019-02-02 ENCOUNTER — Encounter: Payer: Self-pay | Admitting: Registered Nurse

## 2019-02-02 ENCOUNTER — Telehealth: Payer: Self-pay | Admitting: Registered Nurse

## 2019-02-02 NOTE — Telephone Encounter (Signed)
Patient wanted me to know that her Gyn provider entered new Rx for motrin refill in Epic and that she had to take home pregnancy test prior to restarting micronor.  Patient had no further questions at this time and motrin was last dispensed from [facility] formulary to patient on 01/19/2019.

## 2019-04-20 ENCOUNTER — Other Ambulatory Visit: Payer: Self-pay | Admitting: Certified Nurse Midwife

## 2019-04-24 NOTE — Telephone Encounter (Signed)
Refill request 04-20-19. Pharmacy sending another request. Routed to Melvia Heaps, NP

## 2019-07-07 ENCOUNTER — Telehealth: Payer: Self-pay | Admitting: *Deleted

## 2019-07-07 NOTE — Telephone Encounter (Signed)
Noted agreed with plan of care per CDC guidelines.  I will follow up with patient via telephone this weekend.

## 2019-07-07 NOTE — Telephone Encounter (Signed)
RN notified by HR manager of pt's daughter testing positive for Covid by rapid test yesterday, 07/06/19. Daughter had reported chills, no fever the day before. There have been 2 recent cases of Covid at daughter's workplace (a daycare). 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 Grandchildren are not symptomatic and are scheduled for covid testing today. Pt denies any sx for herself.   Advised pt to follow 14 day asymptomatic with exposure to known positive person quarantine per CDC recommendations. Advised pt that Day 1 of quarantine is 07/07/19. Pt prefers to obtain her own testing, likely rapid, near her home citing a sister that is currently under hospice care and she would like to have results as soon as possible in case her quarantine can be shortened if she has a positive test. Recommended testing no earlier than Tuesday 3/16. She said she may go Monday or Tuesday next week. Advised pt to contact RN once she has testing performed and/or if she develops symptoms. RN gave pt phone and email contact info. Will plan to f/u with pt Tuesday 3/16 regardless.  If no sx develop and no fever in previous >24hrs without antipyretics, pt currently scheduled to complete quarantine 3/25 and return to work 07/21/19.    Reviewed possible Covid sx including cough, ShOB, sinusitis sx, sore throat, fever/chills, body aches, fatigue, loss of taste/smell, GI sx n/v/d. Also reviewed same day/emergent eval/ER precautions of dizziness/syncope, confusion, blue tint to lips/face, severe ShOB/difficulty breathing.    Pt verbalizes understanding and agreement with plan of care. No further questions/concerns at this time. Pt reminded to contact clinic with any changes in sx or questions/concerns.

## 2019-07-10 NOTE — Telephone Encounter (Signed)
Noted sister died recently and patient may be taking bereavement leave in addition to covid quarantine/monitoring for symptoms rapid covid test negative for patient and grandchildren.  Reminder may take up to 14 days to develop symptoms.  Remind patient if she needs [facility] services they are also available to her.

## 2019-07-10 NOTE — Telephone Encounter (Signed)
Pt called clinic this morning. She reported that she and her grandchildren had rapid tested performed this morning at [facility]. She reports negative tests for herself and grandkids. She remains asymptomatic. She also reports that her sister passed away 08-19-2022 evening 08-02-22. Advised her that her quarantine dates remain unchanged currently through 3/24 and RTW 3/25. However also advised her to contact Hyacinth Meeker, HR manager to discuss RTW around bereavement leave as well and any time after 3/25 could be worked out with him and her supervisor. Reminded pt to call clinic if she develops sx during her quarantine. Hyacinth Meeker will also be updated by RN on test results as pt has not yet spoken with HR. Pt verbalizes understanding and agreement with plan of care. No further questions/concerns.

## 2019-07-19 ENCOUNTER — Encounter: Payer: Self-pay | Admitting: Certified Nurse Midwife

## 2019-08-14 ENCOUNTER — Telehealth: Payer: Self-pay

## 2019-08-14 DIAGNOSIS — N951 Menopausal and female climacteric states: Secondary | ICD-10-CM

## 2019-08-14 NOTE — Telephone Encounter (Signed)
Patient called in regards to medication Norlyda. Patient stated Dr. Hollice Espy prescribed this medication for possible menopause. Patient stated she has no had a menstrual cycle and would like to know if she needs a refill to continue taking this medication.

## 2019-08-14 NOTE — Telephone Encounter (Signed)
AEX 09/2018 with DL  Next AEX 6/21 with SM FSH 6.7, Prolactin 21.4, TSH 0.959 in 09/2018. Pap Normal and Neg HR HPV 09/2018. MMG- dx 12/2018 Birads 3, probably benign and f/u in 1 year.   Spoke with pt. Pt reports out of Norlyda Rx as of 4/15 and wanting to know if she should continue until next AEX with Dr Sabra Heck?  Pt states having cycle by provera in 10/2018 and 11/2018, but no cycle since then.  Pt denies other vasomotor sx. Pt states unsure of family hx with mother that is deceased and two living sisters that don't have menopause sx.   Will review with Dr Sabra Heck and return call to pt with recommendations. Pt agreeable.   Routing to Dr Sabra Heck.

## 2019-08-15 NOTE — Telephone Encounter (Signed)
She could either have her [place] repeated now and if elevated would not need anything else.  Or she could take a UPT and if negative, take Provera 10mg  x 10 days.  If she had bleeding could then transition back to the [place] daily (progesterone only pill).  That way she would not need to keep taking the intermittent progesterone for withdrawal bleeding.

## 2019-08-15 NOTE — Telephone Encounter (Signed)
Spoke with pt. Pt given recommendations per Dr Sabra Heck. Pt prefers to come and get [facility] repeated. Pt scheduled for lab visit on 08/17/19 at 3:45 pm. Pt verbalized understanding with date and time. Future orders placed.   Routing to Dr Sabra Heck for review.  Encounter closed.

## 2019-08-17 ENCOUNTER — Other Ambulatory Visit: Payer: Self-pay

## 2019-08-17 ENCOUNTER — Other Ambulatory Visit: Payer: PRIVATE HEALTH INSURANCE

## 2019-08-17 DIAGNOSIS — N951 Menopausal and female climacteric states: Secondary | ICD-10-CM

## 2019-08-18 LAB — FOLLICLE STIMULATING HORMONE: FSH: 75.3 m[IU]/mL

## 2019-08-21 ENCOUNTER — Other Ambulatory Visit: Payer: Self-pay

## 2019-09-21 ENCOUNTER — Telehealth: Payer: Self-pay | Admitting: Registered Nurse

## 2019-09-21 ENCOUNTER — Encounter: Payer: Self-pay | Admitting: Registered Nurse

## 2019-09-21 NOTE — Telephone Encounter (Signed)
Patient requested refill ibuprofen 800mg  po TID prn pain #30 RF0 last filled 03/29/2020 from [facility].  Dispensed today from [place] [facility] formulary to patient.  Her current Rx expires 10/04/2019 notified will need new Rx for next fill.  Patient verbalized understanding information/instructions and had no further questions at this time.

## 2019-10-24 ENCOUNTER — Ambulatory Visit: Payer: PRIVATE HEALTH INSURANCE | Admitting: Obstetrics & Gynecology

## 2019-10-24 ENCOUNTER — Ambulatory Visit: Payer: PRIVATE HEALTH INSURANCE | Admitting: Certified Nurse Midwife

## 2019-10-26 ENCOUNTER — Ambulatory Visit: Payer: PRIVATE HEALTH INSURANCE | Admitting: Obstetrics and Gynecology

## 2019-11-03 ENCOUNTER — Other Ambulatory Visit: Payer: Self-pay

## 2019-11-03 ENCOUNTER — Ambulatory Visit (INDEPENDENT_AMBULATORY_CARE_PROVIDER_SITE_OTHER): Payer: PRIVATE HEALTH INSURANCE | Admitting: Obstetrics and Gynecology

## 2019-11-03 ENCOUNTER — Encounter: Payer: Self-pay | Admitting: Obstetrics and Gynecology

## 2019-11-03 VITALS — BP 120/72 | HR 86 | Ht 64.0 in | Wt 187.0 lb

## 2019-11-03 DIAGNOSIS — Z01419 Encounter for gynecological examination (general) (routine) without abnormal findings: Secondary | ICD-10-CM | POA: Diagnosis not present

## 2019-11-03 DIAGNOSIS — R7989 Other specified abnormal findings of blood chemistry: Secondary | ICD-10-CM | POA: Diagnosis not present

## 2019-11-03 DIAGNOSIS — N951 Menopausal and female climacteric states: Secondary | ICD-10-CM

## 2019-11-03 DIAGNOSIS — E038 Other specified hypothyroidism: Secondary | ICD-10-CM | POA: Diagnosis not present

## 2019-11-03 DIAGNOSIS — Z0000 Encounter for general adult medical examination without abnormal findings: Secondary | ICD-10-CM

## 2019-11-03 MED ORDER — IBUPROFEN 800 MG PO TABS: 800.0000 mg | ORAL_TABLET | Freq: Three times a day (TID) | ORAL | 1 refills | Status: DC | PRN

## 2019-11-03 NOTE — Progress Notes (Signed)
54 y.o. G1P0001 Married Black or [place] American Not Hispanic or Latino female here for annual exam.  Patient would like ibuprofen 800 so she can take it to work to get it filled. She needs it prn muscle aches.  No vaginal bleeding for 9 months. Some mild vasomotor symptoms. Tolerable. No vaginal dryness or dyspareunia.  Britton in 4/21 was 75.     No LMP recorded. (Menstrual status: Perimenopausal).          Sexually active: Yes.    The current method of family planning is none.    Exercising: No.  The patient does not participate in regular exercise at present. Smoker:  no  Health Maintenance: Pap:  10/18/18 neg HPV Neg   08-27-15 neg HPV HR neg History of abnormal Pap:  Yes cyrosurgery over 30 years ago.  MMG:  12/29/18 Density C Bi-rads 3 probably Benign  BMD:   None  Colonoscopy: 2016 f/u 10 years  TDaP:  2013 Gardasil: NA   reports that she has never smoked. She has never used smokeless tobacco. She reports current alcohol use. She reports that she does not use drugs. Minimal ETOH. She works in processing orders, lots of walking. Daughter lives locally, 3 grandchildren (43, 13 and 4).   Past Medical History:  Diagnosis Date  . Abnormal Pap smear of cervix    30 yrs ago  . Anemia   . Bacterial vaginosis   . Hematuria   . Infertility, female   . Thyroid nodule    LEFT 7/09    Past Surgical History:  Procedure Laterality Date  . CRYOTHERAPY     for abnormal pap smear 49yrs ago  . GYNECOLOGIC CRYOSURGERY  1987   abnormal pap - normal since  . THYROID LOBECTOMY Right 2/95    Current Outpatient Medications  Medication Sig Dispense Refill  . ibuprofen (ADVIL) 800 MG tablet Take 1 tablet (800 mg total) by mouth every 8 (eight) hours as needed. 30 tablet 0  . levothyroxine (SYNTHROID) 75 MCG tablet Take 1 tablet (75 mcg total) by mouth daily before breakfast. 30 tablet 11  . loratadine (CLARITIN) 10 MG tablet Take 1 tablet (10 mg total) by mouth daily. 30 tablet 11  . Multiple  Vitamin (MULTI-VITAMIN PO) Take by mouth daily.    . Probiotic Product (PROBIOTIC PO) Take by mouth.     No current facility-administered medications for this visit.    Family History  Problem Relation Age of Onset  . Ovarian cancer Mother 74       ovarian cancer  . Diabetes Mother   . Prostate cancer Father 46       prostate cancer  . Liver cancer Brother 57       liver cancer  . Kidney disease Sister        both kidneys removed  . Prostate cancer Brother   Sister just died of a stroke this year at 66. She is one of 11 kids. 2 brothers have passed.  Mom with ovarian cancer at 48  Review of Systems  All other systems reviewed and are negative.   Exam:   BP 120/72   Pulse 86   Ht 5\' 4"  (1.626 m)   Wt 187 lb (84.8 kg)   SpO2 99%   BMI 32.10 kg/m   Weight change: @WEIGHTCHANGE @ Height:   Height: 5\' 4"  (162.6 cm)  Ht Readings from Last 3 Encounters:  11/03/19 5\' 4"  (1.626 m)  10/18/18 5' 3.5" (1.613 m)  07/18/18  5' 3.5" (1.613 m)    General appearance: alert, cooperative and appears stated age Head: Normocephalic, without obvious abnormality, atraumatic Neck: no adenopathy, supple, symmetrical, trachea midline and thyroid normal to inspection and palpation Lungs: clear to auscultation bilaterally Cardiovascular: regular rate and rhythm Breasts: normal appearance, no masses or tenderness Abdomen: soft, non-tender; non distended,  no masses,  no organomegaly Extremities: extremities normal, atraumatic, no cyanosis or edema Skin: Skin color, texture, turgor normal. No rashes or lesions Lymph nodes: Cervical, supraclavicular, and axillary nodes normal. No abnormal inguinal nodes palpated Neurologic: Grossly normal   Pelvic: External genitalia:  no lesions              Urethra:  normal appearing urethra with no masses, tenderness or lesions              Bartholins and Skenes: normal                 Vagina: normal appearing vagina with normal color and discharge, no  lesions              Cervix: no lesions               Bimanual Exam:  Uterus:  normal size, contour, position, consistency, mobility, non-tender              Adnexa: no mass, fullness, tenderness               Rectovaginal: Confirms               Anus:  normal sphincter tone, no lesions  Gae Dry chaperoned for the exam.  A:  Well Woman with normal exam  Hypothyroid  Vit d def  Perimenopausal, no bleeding for 9 months  P:   No pap this year  Mammogram in 9/21  Colonoscopy is UTD  Discussed breast self exam  Discussed calcium and vit D intake  Ibuprofen script given  Will refill synthroid once TSH is back  Call with any bleeding

## 2019-11-03 NOTE — Patient Instructions (Addendum)
EXERCISE AND DIET:  We recommended that you start or continue a regular exercise program for good health. Regular exercise means any activity that makes your heart beat faster and makes you sweat.  We recommend exercising at least 30 minutes per day at least 3 days a week, preferably 4 or 5.  We also recommend a diet low in fat and sugar.  Inactivity, poor dietary choices and obesity can cause diabetes, heart attack, stroke, and kidney damage, among others.    ALCOHOL AND SMOKING:  Women should limit their alcohol intake to no more than 7 drinks/beers/glasses of wine (combined, not each!) per week. Moderation of alcohol intake to this level decreases your risk of breast cancer and liver damage. And of course, no recreational drugs are part of a healthy lifestyle.  And absolutely no smoking or even second hand smoke. Most people know smoking can cause heart and lung diseases, but did you know it also contributes to weakening of your bones? Aging of your skin?  Yellowing of your teeth and nails?  CALCIUM AND VITAMIN D:  Adequate intake of calcium and Vitamin D are recommended.  The recommendations for exact amounts of these supplements seem to change often, but generally speaking 1,200 mg of calcium (between diet and supplement) and 800 units of Vitamin D per day seems prudent. Certain women may benefit from higher intake of Vitamin D.  If you are among these women, your doctor will have told you during your visit.    PAP SMEARS:  Pap smears, to check for cervical cancer or precancers,  have traditionally been done yearly, although recent scientific advances have shown that most women can have pap smears less often.  However, every woman still should have a physical exam from her gynecologist every year. It will include a breast check, inspection of the vulva and vagina to check for abnormal growths or skin changes, a visual exam of the cervix, and then an exam to evaluate the size and shape of the uterus and  ovaries.  And after 55 years of age, a rectal exam is indicated to check for rectal cancers. We will also provide age appropriate advice regarding health maintenance, like when you should have certain vaccines, screening for sexually transmitted diseases, bone density testing, colonoscopy, mammograms, etc.   MAMMOGRAMS:  All women over 40 years old should have a yearly mammogram. Many facilities now offer a "3D" mammogram, which may cost around $50 extra out of pocket. If possible,  we recommend you accept the option to have the 3D mammogram performed.  It both reduces the number of women who will be called back for extra views which then turn out to be normal, and it is better than the routine mammogram at detecting truly abnormal areas.    COLON CANCER SCREENING: Now recommend starting at age 45. At this time colonoscopy is not covered for routine screening until 50. There are take home tests that can be done between 45-49.   COLONOSCOPY:  Colonoscopy to screen for colon cancer is recommended for all women at age 50.  We know, you hate the idea of the prep.  We agree, BUT, having colon cancer and not knowing it is worse!!  Colon cancer so often starts as a polyp that can be seen and removed at colonscopy, which can quite literally save your life!  And if your first colonoscopy is normal and you have no family history of colon cancer, most women don't have to have it again for   10 years.  Once every ten years, you can do something that may end up saving your life, right?  We will be happy to help you get it scheduled when you are ready.  Be sure to check your insurance coverage so you understand how much it will cost.  It may be covered as a preventative service at no cost, but you should check your particular policy.      Breast Self-Awareness Breast self-awareness means being familiar with how your breasts look and feel. It involves checking your breasts regularly and reporting any changes to your  health care provider. Practicing breast self-awareness is important. A change in your breasts can be a sign of a serious medical problem. Being familiar with how your breasts look and feel allows you to find any problems early, when treatment is more likely to be successful. All women should practice breast self-awareness, including women who have had breast implants. How to do a breast self-exam One way to learn what is normal for your breasts and whether your breasts are changing is to do a breast self-exam. To do a breast self-exam: Look for Changes  1. Remove all the clothing above your waist. 2. Stand in front of a mirror in a room with good lighting. 3. Put your hands on your hips. 4. Push your hands firmly downward. 5. Compare your breasts in the mirror. Look for differences between them (asymmetry), such as: ? Differences in shape. ? Differences in size. ? Puckers, dips, and bumps in one breast and not the other. 6. Look at each breast for changes in your skin, such as: ? Redness. ? Scaly areas. 7. Look for changes in your nipples, such as: ? Discharge. ? Bleeding. ? Dimpling. ? Redness. ? A change in position. Feel for Changes Carefully feel your breasts for lumps and changes. It is best to do this while lying on your back on the floor and again while sitting or standing in the shower or tub with soapy water on your skin. Feel each breast in the following way:  Place the arm on the side of the breast you are examining above your head.  Feel your breast with the other hand.  Start in the nipple area and make  inch (2 cm) overlapping circles to feel your breast. Use the pads of your three middle fingers to do this. Apply light pressure, then medium pressure, then firm pressure. The light pressure will allow you to feel the tissue closest to the skin. The medium pressure will allow you to feel the tissue that is a little deeper. The firm pressure will allow you to feel the tissue  close to the ribs.  Continue the overlapping circles, moving downward over the breast until you feel your ribs below your breast.  Move one finger-width toward the center of the body. Continue to use the  inch (2 cm) overlapping circles to feel your breast as you move slowly up toward your collarbone.  Continue the up and down exam using all three pressures until you reach your armpit.  Write Down What You Find  Write down what is normal for each breast and any changes that you find. Keep a written record with breast changes or normal findings for each breast. By writing this information down, you do not need to depend only on memory for size, tenderness, or location. Write down where you are in your menstrual cycle, if you are still menstruating. If you are having trouble noticing differences   in your breasts, do not get discouraged. With time you will become more familiar with the variations in your breasts and more comfortable with the exam. How often should I examine my breasts? Examine your breasts every month. If you are breastfeeding, the best time to examine your breasts is after a feeding or after using a breast pump. If you menstruate, the best time to examine your breasts is 5-7 days after your period is over. During your period, your breasts are lumpier, and it may be more difficult to notice changes. When should I see my health care provider? See your health care provider if you notice:  A change in shape or size of your breasts or nipples.  A change in the skin of your breast or nipples, such as a reddened or scaly area.  Unusual discharge from your nipples.  A lump or thick area that was not there before.  Pain in your breasts.  Anything that concerns you.   Perimenopause  Perimenopause is the normal time of life before and after menstrual periods stop completely (menopause). Perimenopause can begin 2-8 years before menopause, and it usually lasts for 1 year after  menopause. During perimenopause, the ovaries may or may not produce an egg. What are the causes? This condition is caused by a natural change in hormone levels that happens as you get older. What increases the risk? This condition is more likely to start at an earlier age if you have certain medical conditions or treatments, including:  A tumor of the pituitary gland in the brain.  A disease that affects the ovaries and hormone production.  Radiation treatment for cancer.  Certain cancer treatments, such as chemotherapy or hormone (anti-estrogen) therapy.  Heavy smoking and excessive alcohol use.  Family history of early menopause. What are the signs or symptoms? Perimenopausal changes affect each woman differently. Symptoms of this condition may include:  Hot flashes.  Night sweats.  Irregular menstrual periods.  Decreased sex drive.  Vaginal dryness.  Headaches.  Mood swings.  Depression.  Memory problems or trouble concentrating.  Irritability.  Tiredness.  Weight gain.  Anxiety.  Trouble getting pregnant. How is this diagnosed? This condition is diagnosed based on your medical history, a physical exam, your age, your menstrual history, and your symptoms. Hormone tests may also be done. How is this treated? In some cases, no treatment is needed. You and your health care provider should make a decision together about whether treatment is necessary. Treatment will be based on your individual condition and preferences. Various treatments are available, such as:  Menopausal hormone therapy (MHT).  Medicines to treat specific symptoms.  Acupuncture.  Vitamin or herbal supplements. Before starting treatment, make sure to let your health care provider know if you have a personal or family history of:  Heart disease.  Breast cancer.  Blood clots.  Diabetes.  Osteoporosis. Follow these instructions at home: Lifestyle  Do not use any products that  contain nicotine or tobacco, such as cigarettes and e-cigarettes. If you need help quitting, ask your health care provider.  Eat a balanced diet that includes fresh fruits and vegetables, whole grains, soybeans, eggs, lean meat, and low-fat dairy.  Get at least 30 minutes of physical activity on 5 or more days each week.  Avoid alcoholic and caffeinated beverages, as well as spicy foods. This may help prevent hot flashes.  Get 7-8 hours of sleep each night.  Dress in layers that can be removed to help you manage hot   flashes.  Find ways to manage stress, such as deep breathing, meditation, or journaling. General instructions  Keep track of your menstrual periods, including: ? When they occur. ? How heavy they are and how long they last. ? How much time passes between periods.  Keep track of your symptoms, noting when they start, how often you have them, and how long they last.  Take over-the-counter and prescription medicines only as told by your health care provider.  Take vitamin supplements only as told by your health care provider. These may include calcium, vitamin E, and vitamin D.  Use vaginal lubricants or moisturizers to help with vaginal dryness and improve comfort during sex.  Talk with your health care provider before starting any herbal supplements.  Keep all follow-up visits as told by your health care provider. This is important. This includes any group therapy or counseling. Contact a health care provider if:  You have heavy vaginal bleeding or pass blood clots.  Your period lasts more than 2 days longer than normal.  Your periods are recurring sooner than 21 days.  You bleed after having sex. Get help right away if:  You have chest pain, trouble breathing, or trouble talking.  You have severe depression.  You have pain when you urinate.  You have severe headaches.  You have vision problems. Summary  Perimenopause is the time when a woman's body  begins to move into menopause. This may happen naturally or as a result of other health problems or medical treatments.  Perimenopause can begin 2-8 years before menopause, and it usually lasts for 1 year after menopause.  Perimenopausal symptoms can be managed through medicines, lifestyle changes, and complementary therapies such as acupuncture. This information is not intended to replace advice given to you by your health care provider. Make sure you discuss any questions you have with your health care provider. Document Revised: 03/26/2017 Document Reviewed: 05/19/2016 Elsevier Patient Education  2020 [employer]  

## 2019-11-04 LAB — LIPID PANEL
Chol/HDL Ratio: 3.4 ratio (ref 0.0–4.4)
Cholesterol, Total: 181 mg/dL (ref 100–199)
HDL: 54 mg/dL (ref >39)
LDL Chol Calc (NIH): 116 mg/dL — ABNORMAL HIGH (ref 0–99)
Triglycerides: 56 mg/dL (ref 0–149)
VLDL Cholesterol Cal: 11 mg/dL (ref 5–40)

## 2019-11-04 LAB — TSH: TSH: 1 u[IU]/mL (ref 0.450–4.500)

## 2019-11-04 LAB — COMPREHENSIVE METABOLIC PANEL
ALT: 14 IU/L (ref 0–32)
AST: 16 IU/L (ref 0–40)
Albumin/Globulin Ratio: 1.5 (ref 1.2–2.2)
Albumin: 4.2 g/dL (ref 3.8–4.9)
Alkaline Phosphatase: 80 IU/L (ref 48–121)
BUN/Creatinine Ratio: 15 (ref 9–23)
BUN: 12 mg/dL (ref 6–24)
Bilirubin Total: 0.4 mg/dL (ref 0.0–1.2)
CO2: 25 mmol/L (ref 20–29)
Calcium: 9.1 mg/dL (ref 8.7–10.2)
Chloride: 102 mmol/L (ref 96–106)
Creatinine, Ser: 0.81 mg/dL (ref 0.57–1.00)
GFR calc Af Amer: 95 mL/min/{1.73_m2} (ref >59)
GFR calc non Af Amer: 82 mL/min/{1.73_m2} (ref >59)
Globulin, Total: 2.8 g/dL (ref 1.5–4.5)
Glucose: 84 mg/dL (ref 65–99)
Potassium: 3.7 mmol/L (ref 3.5–5.2)
Sodium: 140 mmol/L (ref 134–144)
Total Protein: 7 g/dL (ref 6.0–8.5)

## 2019-11-04 LAB — CBC
Hematocrit: 35.1 % (ref 34.0–46.6)
Hemoglobin: 11.6 g/dL (ref 11.1–15.9)
MCH: 30.8 pg (ref 26.6–33.0)
MCHC: 33 g/dL (ref 31.5–35.7)
MCV: 93 fL (ref 79–97)
Platelets: 321 10*3/uL (ref 150–450)
RBC: 3.77 x10E6/uL (ref 3.77–5.28)
RDW: 12.3 % (ref 11.7–15.4)
WBC: 7.8 10*3/uL (ref 3.4–10.8)

## 2019-11-04 LAB — VITAMIN D 25 HYDROXY (VIT D DEFICIENCY, FRACTURES): Vit D, 25-Hydroxy: 22.2 ng/mL — ABNORMAL LOW (ref 30.0–100.0)

## 2019-11-13 ENCOUNTER — Other Ambulatory Visit: Payer: Self-pay | Admitting: *Deleted

## 2019-11-13 DIAGNOSIS — E039 Hypothyroidism, unspecified: Secondary | ICD-10-CM

## 2019-11-13 MED ORDER — LEVOTHYROXINE SODIUM 75 MCG PO TABS: 75.0000 ug | ORAL_TABLET | Freq: Every day | ORAL | 3 refills | Status: DC

## 2020-01-12 ENCOUNTER — Other Ambulatory Visit: Payer: Self-pay | Admitting: Obstetrics and Gynecology

## 2020-01-12 DIAGNOSIS — N6310 Unspecified lump in the right breast, unspecified quadrant: Secondary | ICD-10-CM

## 2020-01-16 ENCOUNTER — Encounter: Payer: Self-pay | Admitting: Registered Nurse

## 2020-01-16 ENCOUNTER — Telehealth: Payer: Self-pay | Admitting: Registered Nurse

## 2020-01-16 NOTE — Telephone Encounter (Signed)
Patient requested refill ibuprofen 800mg  po TID prn pain #30 RF0 last filled 09/21/2019 from [facility].  Dispensed today from [place] [facility] formulary to patient.  Her current Rx expires with next 30 day supply refill.  Patient notified will need new Rx from [facility] only 1 refill remaining.  Patient verbalized understanding information/instructions and had no further questions at this time.

## 2020-01-29 ENCOUNTER — Other Ambulatory Visit: Payer: Self-pay

## 2020-01-29 ENCOUNTER — Ambulatory Visit
Admission: RE | Admit: 2020-01-29 | Discharge: 2020-01-29 | Disposition: A | Discharge status: Home / Self Care | Payer: PRIVATE HEALTH INSURANCE | Source: Ambulatory Visit | Attending: Obstetrics and Gynecology | Admitting: Obstetrics and Gynecology

## 2020-01-29 ENCOUNTER — Ambulatory Visit
Admission: RE | Admit: 2020-01-29 | Discharge: 2020-01-29 | Disposition: A | Discharge status: Home / Self Care | Payer: No Typology Code available for payment source | Source: Ambulatory Visit | Attending: Obstetrics and Gynecology | Admitting: Obstetrics and Gynecology

## 2020-01-29 DIAGNOSIS — N6310 Unspecified lump in the right breast, unspecified quadrant: Secondary | ICD-10-CM

## 2020-02-29 ENCOUNTER — Other Ambulatory Visit: Payer: Self-pay

## 2020-02-29 ENCOUNTER — Ambulatory Visit: Payer: Self-pay | Admitting: Registered Nurse

## 2020-02-29 ENCOUNTER — Encounter: Payer: Self-pay | Admitting: Registered Nurse

## 2020-02-29 VITALS — BP 161/93 | HR 81 | Temp 98.1°F

## 2020-02-29 DIAGNOSIS — M7989 Other specified soft tissue disorders: Secondary | ICD-10-CM

## 2020-02-29 DIAGNOSIS — I10 Essential (primary) hypertension: Secondary | ICD-10-CM

## 2020-02-29 DIAGNOSIS — M722 Plantar fascial fibromatosis: Secondary | ICD-10-CM

## 2020-02-29 NOTE — Progress Notes (Signed)
Subjective:    Patient ID: Dawn Trevino, female    DOB: January 27, 1966, 54 y.o.   MRN: [id number]  54y/o african [place] female established patient here for reoccurrence of plantar fasciitis "my feet are burning again"  I saw a podiatrist once and he prescribed some medication do I have to go back to him again to get meds?  Chronic left lower leg swelling bothering her again also.  "I talked to my PCM about it"  "I know I have varicose veins" I wear compression socks sometimes and take motrin 800mg  prn"  She has been wearing her ugg sherpa boots about a week now.  Not walking barefoot.  Heel and midfoot most sore mid day at work.  Hasn't tried stretches/icing/inserts.  Has inserts at home from podiatry but stopped wearing them when pain went away.  Denied injury or trauma, rash, areas that are hot to touch.  PMHx anemia, Vitamin D deficiency, overweight, hypothyroidism.  Labs done this summer with PCM.  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  Typically wears sneakers at work.     Review of Systems  Constitutional: Negative for activity change, appetite change, chills, diaphoresis, fatigue and fever.  HENT: Negative for trouble swallowing and voice change.   Eyes: Negative for photophobia and visual disturbance.  Respiratory: Negative for cough, shortness of breath, wheezing and stridor.   Cardiovascular: Positive for leg swelling. Negative for palpitations.  Gastrointestinal: Negative for diarrhea, nausea and vomiting.  Endocrine: Negative for cold intolerance and heat intolerance.  Genitourinary: Negative for difficulty urinating.  Musculoskeletal: Positive for myalgias. Negative for gait problem, joint swelling, neck pain and neck stiffness.  Skin: Negative for color change, pallor, rash and wound.  Allergic/Immunologic: Positive for environmental  allergies. Negative for food allergies.  Neurological: Negative for tremors, weakness and numbness.  Hematological: Negative for adenopathy. Does not bruise/bleed easily.  Psychiatric/Behavioral: Negative for agitation, confusion and sleep disturbance.       Objective:   Physical Exam Vitals and nursing note reviewed.  Constitutional:      General: She is awake. She is not in acute distress.    Appearance: Normal appearance. She is well-developed, well-groomed and overweight. She is not ill-appearing, toxic-appearing or diaphoretic.  HENT:     Head: Normocephalic and atraumatic.     Jaw: There is normal jaw occlusion.     Salivary Glands: Right salivary gland is not diffusely enlarged. Left salivary gland is not diffusely enlarged.     Right Ear: Hearing and external ear normal.     Left Ear: Hearing and external ear normal.     Nose: Nose normal. No congestion or rhinorrhea.     Mouth/Throat:     Mouth: No angioedema.     Pharynx: Oropharynx is clear.  Eyes:     General: Lids are normal. Vision grossly intact. Gaze aligned appropriately. No visual field deficit or scleral icterus.    Extraocular Movements: Extraocular movements intact.     Conjunctiva/sclera: Conjunctivae normal.  Neck:     Trachea: Trachea and phonation normal.  Cardiovascular:     Rate and Rhythm: Normal rate and regular rhythm.     Pulses: Normal pulses.          Radial pulses are 2+ on the right side and 2+ on the left side.       Dorsalis pedis pulses are 2+ on the right side and 2+ on  the left side.     Comments: Trace nonpitting edema right and left 1-2+/4 lower legs/ankles Pulmonary:     Effort: Pulmonary effort is normal. No respiratory distress.     Breath sounds: Normal breath sounds and air entry. No stridor, decreased air movement or transmitted upper airway sounds. No wheezing or rhonchi.     Comments: Wearing mask due to covid 19 pandemic; spoke full sentences without difficulty; no cough in  clinic Abdominal:     General: Abdomen is flat.     Palpations: Abdomen is soft.  Musculoskeletal:        General: Swelling present. No deformity or signs of injury.     Right shoulder: Normal.     Left shoulder: Normal.     Right elbow: Normal.     Left elbow: Normal.     Right hand: Normal.     Left hand: Normal.     Cervical back: Normal, normal range of motion and neck supple. No swelling, edema, deformity, erythema, signs of trauma, lacerations, rigidity, torticollis or crepitus. No pain with movement. Normal range of motion.     Thoracic back: Normal. No swelling, edema, deformity, signs of trauma or lacerations.     Lumbar back: Normal. No swelling, edema, deformity or signs of trauma.     Left lower leg: Swelling present. No deformity, lacerations, tenderness or bony tenderness. 2+ Edema present.     Right ankle: No deformity, ecchymosis or lacerations. No tenderness. Normal range of motion. Normal pulse.     Right Achilles Tendon: No tenderness or defects.     Left ankle: Swelling present. No deformity, ecchymosis or lacerations. No tenderness. Normal range of motion. Normal pulse.     Left Achilles Tendon: No tenderness or defects.     Right foot: Normal range of motion and normal capillary refill. Tenderness present. No swelling, deformity, bunion, Charcot foot, foot drop, prominent metatarsal heads, laceration, bony tenderness or crepitus. Normal pulse.       Feet:  Feet:     Right foot:     Skin integrity: Callus and dry skin present. No ulcer, blister, skin breakdown, erythema, warmth or fissure.     Toenail Condition: Right toenails are abnormally thick.     Left foot:     Skin integrity: Callus and dry skin present. No ulcer, blister, skin breakdown, erythema or fissure.     Comments: Scattered nummular 4-33mm hyperpigmented lesions plantar feet; fine scale moccasin pattern and callous heels/toes Lymphadenopathy:     Head:     Right side of head: No preauricular  adenopathy.     Left side of head: No preauricular adenopathy.     Cervical: No cervical adenopathy.     Right cervical: No superficial cervical adenopathy.    Left cervical: No superficial cervical adenopathy.  Skin:    General: Skin is warm and dry.     Capillary Refill: Capillary refill takes less than 2 seconds.     Coloration: Skin is not ashen, cyanotic, jaundiced, mottled, pale or sallow.     Findings: Rash present. No abrasion, abscess, acne, bruising, burn, ecchymosis, erythema, signs of injury, laceration, lesion, petechiae or wound. Rash is macular and scaling. Rash is not crusting, nodular, papular, purpuric, pustular, urticarial or vesicular.     Nails: There is no clubbing.     Comments: Macular hyperpigmented nummular lesions soles of feet bilaterally; dry skin/callous moccasin pattern bilateral feet  Neurological:     General: No focal deficit present.  Mental Status: She is alert and oriented to person, place, and time. Mental status is at baseline.     GCS: GCS eye subscore is 4. GCS verbal subscore is 5. GCS motor subscore is 6.     Cranial Nerves: Cranial nerves are intact. No cranial nerve deficit, dysarthria or facial asymmetry.     Sensory: Sensation is intact. No sensory deficit.     Motor: Motor function is intact. No weakness, tremor, atrophy, abnormal muscle tone or seizure activity.     Coordination: Coordination is intact. Coordination normal.     Gait: Gait is intact. Gait normal.     Comments: Gait sure and steady in clinic; new Ugg sherpa calf high boots; cotton socks; in/out of chair without difficulty; bilateral hand grasp equal 5/5  Psychiatric:        Attention and Perception: Attention and perception normal.        Mood and Affect: Mood and affect normal.        Speech: Speech normal.        Behavior: Behavior normal. Behavior is cooperative.        Thought Content: Thought content normal.        Cognition and Memory: Cognition and memory normal.         Judgment: Judgment normal.       Results for HIBAH, ODONNELL (MRN [id number]) as of 02/29/2020 21:01  Ref. Range 11/03/2019 15:33  Sodium Latest Ref Range: 134 - 144 mmol/L 140  Potassium Latest Ref Range: 3.5 - 5.2 mmol/L 3.7  Chloride Latest Ref Range: 96 - 106 mmol/L 102  CO2 Latest Ref Range: 20 - 29 mmol/L 25  Glucose Latest Ref Range: 65 - 99 mg/dL 84  BUN Latest Ref Range: 6 - 24 mg/dL 12  Creatinine Latest Ref Range: 0.57 - 1.00 mg/dL 0.81  Calcium Latest Ref Range: 8.7 - 10.2 mg/dL 9.1  BUN/Creatinine Ratio Latest Ref Range: 9 - 23  15  Alkaline Phosphatase Latest Ref Range: 48 - 121 IU/L 80  Albumin Latest Ref Range: 3.8 - 4.9 g/dL 4.2  Albumin/Globulin Ratio Latest Ref Range: 1.2 - 2.2  1.5  AST Latest Ref Range: 0 - 40 IU/L 16  ALT Latest Ref Range: 0 - 32 IU/L 14  Total Protein Latest Ref Range: 6.0 - 8.5 g/dL 7.0  Total Bilirubin Latest Ref Range: 0.0 - 1.2 mg/dL 0.4  GFR, Est Non African American Latest Ref Range: >59 mL/min/1.73 82  GFR, Est African American Latest Ref Range: >59 mL/min/1.73 95  Total CHOL/HDL Ratio Latest Ref Range: 0.0 - 4.4 ratio 3.4  Cholesterol, Total Latest Ref Range: 100 - 199 mg/dL 181  HDL Cholesterol Latest Ref Range: >39 mg/dL 54  Triglycerides Latest Ref Range: 0 - 149 mg/dL 56  VLDL Cholesterol Cal Latest Ref Range: 5 - 40 mg/dL 11  LDL Chol Calc (NIH) Latest Ref Range: 0 - 99 mg/dL 116 (H)  Vitamin D, 25-Hydroxy Latest Ref Range: 30.0 - 100.0 ng/mL 22.2 (L)  Globulin, Total Latest Ref Range: 1.5 - 4.5 g/dL 2.8  WBC Latest Ref Range: 3.4 - 10.8 x10E3/uL 7.8  RBC Latest Ref Range: 3.77 - 5.28 x10E6/uL 3.77  Hemoglobin Latest Ref Range: 11.1 - 15.9 g/dL 11.6  HCT Latest Ref Range: 34.0 - 46.6 % 35.1  MCV Latest Ref Range: 79 - 97 fL 93  MCH Latest Ref Range: 26.6 - 33.0 pg 30.8  MCHC Latest Ref Range: 31 - 35 g/dL 33.0  RDW Latest  Ref Range: 11.7 - 15.4 % 12.3  Platelets Latest Ref Range: 150 - 450 x10E3/uL 321   Hematology Comments: Unknown Note:  TSH Latest Ref Range: 0.450 - 4.500 uIU/mL 1.000      Assessment & Plan:  A-bilateral plantar fasciitis initial visit, left lower leg swelling initial visit, essential hypertension, overweight  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 Motrin 800mg  po TID prn pain take at least once a day for two weeks.  [facility] handout on plantar fasciitis and plantar fasciitis rehab exercises printed and given to patient. I recommend achilles/gastrocnemius/foot/plantar stretches and icing 15 minutes at least nightly with frozen water bottle rolling foot over. Restart wearing podiatry shoe inserts as not much arch support in uggs.  Discussed care for podiatry referral. Consider new supportive footwear/OTC inserts [employer] heel cups, shoe inserts OTC may purchase at their website or amazon.  If shoe treads worn out/greater than 48 year old  Typically shoes need to be replaced every 3-4 months depending on mileage (500 miles equals 10,000 steps per day) and if rotating with another pain.  Do not walk barefoot at home or thin leather no support sandals/flip flops/shoes as this can worsen condition/pain.  Consider compression socks/plantar fasciitis socks and weight loss also. Patient verbalized understanding of instructions, agreed with plan of care and had no further questions at this time.  Apply emollient bilateral feet BID especially after shower consider vaseline, cocoa butter, aquaphor, and/or eucerin.  Re-evaluate after a week.  May be athlete's foot, low vitamin D, anemia related burning in feet sensation, ?neuropathy,  If still having moccasin pattern dermatitis consider athlete's foot spray/powder daily for two weeks.  Ensure if socks sweat soaked changing them at lunch and after work.  Rotating footwear to allow thorough drying between wearing.  Trying to get plantar fasciitis flare and leg swelling down and moisturize skin to see if burning  sensation improves with these modalities first.  TSH normal but history hypothyroidism.  Patient verbalized understanding information/instructions, agreed with plan of care and had no further questions at this time.  Discussed purpose, how hydrochlorothiazide works, taking am will start with low dose 12.5mg  po qam #30 RF0 dispensed from [facility] to patient.  If feeling lightheaded or dizzy to notify clinic staff and will decrease to 6.25mg  po daily.  Discussed do not want to take late in the day/before bed because this medication increases urination and helps to decrease leg swelling.  I still recommend compression stockings knee high, elevating feet when sitting, weight loss.  Patient is getting greater than 150 minutes activity per week/exertion at work lifting/walking.  See RN Hildred Alamin for BP check tomorrow/Monday.  Lab nonfasting BMET for kidney and electrolyte check in 2 weeks baseline normal   Consider mediterranean/DASH diet and exercise program 150 minutes exercise/activity per week. Weight check at next BP check.   Recommended weight loss/weight maintenance to BMI 20-25. Return to the clinic if any new symptoms/notify clinic staff if visual changes, frequent headache, chest pain or dyspnea on mild or  minimal exertion. [place] handouts printed and given on edema and preventing hypertension.   Discussed BP goal 110s/60s.  Hypertension now considered 130s/80s and prehypertension 120s/70s.  Patient verbalized agreement and understanding of treatment plan and had no further questions at this time. P2: Diet and Exercise specific for HTN

## 2020-02-29 NOTE — Patient Instructions (Addendum)
Heel that pain plantar fasciitis heel cups/seats or full insoles on [employer] or www.heelthatpain.com Compression socks Ice-frozen water bottle 15 minutes rolled under foot daily after work Do not walk barefoot Plantar fasciitis or compression socks   Preventing Hypertension Hypertension, commonly called high blood pressure, is when the force of blood pumping through the arteries is too strong. Arteries are blood vessels that carry blood from the heart throughout the body. Over time, hypertension can damage the arteries and decrease blood flow to important parts of the body, including the brain, heart, and kidneys. Often, hypertension does not cause symptoms until blood pressure is very high. For this reason, it is important to have your blood pressure checked on a regular basis. Hypertension can often be prevented with diet and lifestyle changes. If you already have hypertension, you can control it with diet and lifestyle changes, as well as medicine. What nutrition changes can be made? Maintain a healthy diet. This includes:  Eating less salt (sodium). Ask your health care provider how much sodium is safe for you to have. The general recommendation is to consume less than 1 tsp (2,300 mg) of sodium a day. ? Do not add salt to your food. ? Choose low-sodium options when grocery shopping and eating out.  Limiting fats in your diet. You can do this by eating low-fat or fat-free dairy products and by eating less red meat.  Eating more fruits, vegetables, and whole grains. Make a goal to eat: ? 1-2 cups of fresh fruits and vegetables each day. ? 3-4 servings of whole grains each day.  Avoiding foods and beverages that have added sugars.  Eating fish that contain healthy fats (omega-3 fatty acids), such as mackerel or salmon. If you need help putting together a healthy eating plan, try the DASH diet. This diet is high in fruits, vegetables, and whole grains. It is low in sodium, red meat, and  added sugars. DASH stands for Dietary Approaches to Stop Hypertension. What lifestyle changes can be made?   Lose weight if you are overweight. Losing just 3?5% of your body weight can help prevent or control hypertension. ? For example, if your present weight is 200 lb (91 kg), a loss of 3-5% of your weight means losing 6-10 lb (2.7-4.5 kg). ? Ask your health care provider to help you with a diet and exercise plan to safely lose weight.  Get enough exercise. Do at least 150 minutes of moderate-intensity exercise each week. ? You could do this in short exercise sessions several times a day, or you could do longer exercise sessions a few times a week. For example, you could take a brisk 10-minute walk or bike ride, 3 times a day, for 5 days a week.  Find ways to reduce stress, such as exercising, meditating, listening to music, or taking a yoga class. If you need help reducing stress, ask your health care provider.  Do not smoke. This includes e-cigarettes. Chemicals in tobacco and nicotine products raise your blood pressure each time you smoke. If you need help quitting, ask your health care provider.  Avoid alcohol. If you drink alcohol, limit alcohol intake to no more than 1 drink a day for nonpregnant women and 2 drinks a day for men. One drink equals 12 oz of beer, 5 oz of wine, or 1 oz of hard liquor. Why are these changes important? Diet and lifestyle changes can help you prevent hypertension, and they may make you feel better overall and improve your quality of  life. If you have hypertension, making these changes will help you control it and help prevent major complications, such as:  Hardening and narrowing of arteries that supply blood to: ? Your heart. This can cause a heart attack. ? Your brain. This can cause a stroke. ? Your kidneys. This can cause kidney failure.  Stress on your heart muscle, which can cause heart failure. What can I do to lower my risk?  Work with your  health care provider to make a hypertension prevention plan that works for you. Follow your plan and keep all follow-up visits as told by your health care provider.  Learn how to check your blood pressure at home. Make sure that you know your personal target blood pressure, as told by your health care provider. How is this treated? In addition to diet and lifestyle changes, your health care provider may recommend medicines to help lower your blood pressure. You may need to try a few different medicines to find what works best for you. You also may need to take more than one medicine. Take over-the-counter and prescription medicines only as told by your health care provider. Where to find support Your health care provider can help you prevent hypertension and help you keep your blood pressure at a healthy level. Your local hospital or your community may also provide support services and prevention programs. The American Heart Association offers an online support network at: CheapBootlegs.com.cy Where to find more information Learn more about hypertension from:  [place], Lung, and Blood Institute: ElectronicHangman.is  Centers for Disease Control and Prevention: https://ingram.com/  American Academy of Family Physicians: http://familydoctor.org/familydoctor/en/diseases-conditions/high-blood-pressure.printerview.all.html Learn more about the DASH diet from:  [place], Lung, and [place]: https://www.reyes.com/ Contact a health care provider if:  You think you are having a reaction to medicines you have taken.  You have recurrent headaches or feel dizzy.  You have swelling in your ankles.  You have trouble with your vision. Summary  Hypertension often does not cause any symptoms until blood pressure is very high. It is important to get your blood pressure checked  regularly.  Diet and lifestyle changes are the most important steps in preventing hypertension.  By keeping your blood pressure in a healthy range, you can prevent complications like heart attack, heart failure, stroke, and kidney failure.  Work with your health care provider to make a hypertension prevention plan that works for you. This information is not intended to replace advice given to you by your health care provider. Make sure you discuss any questions you have with your health care provider. Document Revised: 08/05/2018 Document Reviewed: 12/23/2015 Elsevier Patient Education  2020 [place].  Edema  Edema is an abnormal buildup of fluids in the body tissues and under the skin. Swelling of the legs, feet, and ankles is a common symptom that becomes more likely as you get older. Swelling is also common in looser tissues, like around the eyes. When the affected area is squeezed, the fluid may move out of that spot and leave a dent for a few moments. This dent is called pitting edema. There are many possible causes of edema. Eating too much salt (sodium) and being on your feet or sitting for a long time can cause edema in your legs, feet, and ankles. Hot weather may make edema worse. Common causes of edema include:  Heart failure.  Liver or kidney disease.  Weak leg blood vessels.  Cancer.  An injury.  Pregnancy.  Medicines.  Being obese.  Low protein levels in the blood. Edema is usually painless. Your skin may look swollen or shiny. Follow these instructions at home:  Keep the affected body part raised (elevated) above the level of your heart when you are sitting or lying down.  Do not sit still or stand for long periods of time.  Do not wear tight clothing. Do not wear garters on your upper legs.  Exercise your legs to get your circulation going. This helps to move the fluid back into your blood vessels, and it may help the swelling go down.  Wear elastic  bandages or support stockings to reduce swelling as told by your health care provider.  Eat a low-salt (low-sodium) diet to reduce fluid as told by your health care provider.  Depending on the cause of your swelling, you may need to limit how much fluid you drink (fluid restriction).  Take over-the-counter and prescription medicines only as told by your health care provider. Contact a health care provider if:  Your edema does not get better with treatment.  You have heart, liver, or kidney disease and have symptoms of edema.  You have sudden and unexplained weight gain. Get help right away if:  You develop shortness of breath or chest pain.  You cannot breathe when you lie down.  You develop pain, redness, or warmth in the swollen areas.  You have heart, liver, or kidney disease and suddenly get edema.  You have a fever and your symptoms suddenly get worse. Summary  Edema is an abnormal buildup of fluids in the body tissues and under the skin.  Eating too much salt (sodium) and being on your feet or sitting for a long time can cause edema in your legs, feet, and ankles.  Keep the affected body part raised (elevated) above the level of your heart when you are sitting or lying down. This information is not intended to replace advice given to you by your health care provider. Make sure you discuss any questions you have with your health care provider. Document Revised: 08/31/2018 Document Reviewed: 05/16/2016 Elsevier Patient Education  [place]. Plantar Fasciitis Rehab Ask your health care provider which exercises are safe for you. Do exercises exactly as told by your health care provider and adjust them as directed. It is normal to feel mild stretching, pulling, tightness, or discomfort as you do these exercises. Stop right away if you feel sudden pain or your pain gets worse. Do not begin these exercises until told by your health care provider. Stretching and  range-of-motion exercises These exercises warm up your muscles and joints and improve the movement and flexibility of your foot. These exercises also help to relieve pain. Plantar fascia stretch  1. Sit with your left / right leg crossed over your opposite knee. 2. Hold your heel with one hand with that thumb near your arch. With your other hand, hold your toes and gently pull them back toward the top of your foot. You should feel a stretch on the bottom of your toes or your foot (plantar fascia) or both. 3. Hold this stretch for____15______ seconds. 4. Slowly release your toes and return to the starting position. Repeat ____3______ times. Complete this exercise ___3______ times a day. Gastrocnemius stretch, standing This exercise is also called a calf (gastroc) stretch. It stretches the muscles in the back of the upper calf. 1. Stand with your hands against a wall. 2. Extend your left / right leg behind you, and bend your front knee  slightly. 3. Keeping your heels on the floor and your back knee straight, shift your weight toward the wall. Do not arch your back. You should feel a gentle stretch in your upper left / right calf. 4. Hold this position for ____15______ seconds. Repeat _____3_____ times. Complete this exercise ____3______ times a day. Soleus stretch, standing This exercise is also called a calf (soleus) stretch. It stretches the muscles in the back of the lower calf. 1. Stand with your hands against a wall. 2. Extend your left / right leg behind you, and bend your front knee slightly. 3. Keeping your heels on the floor, bend your back knee and shift your weight slightly over your back leg. You should feel a gentle stretch deep in your lower calf. 4. Hold this position for ___15_______ seconds. Repeat ______3____ times. Complete this exercise ____3______ times a day. Gastroc and soleus stretch, standing step This exercise stretches the muscles in the back of the lower leg. These  muscles are in the upper calf (gastrocnemius) and the lower calf (soleus). 1. Stand with the ball of your left / right foot on a step. The ball of your foot is on the walking surface, right under your toes. 2. Keep your other foot firmly on the same step. 3. Hold on to the wall or a railing for balance. 4. Slowly lift your other foot, allowing your body weight to press your left / right heel down over the edge of the step. You should feel a stretch in your left / right calf. 5. Hold this position for ____15______ seconds. 6. Return both feet to the step. 7. Repeat this exercise with a slight bend in your left / right knee. Repeat _____3_____ times with your left / right knee straight and _____3_____ times with your left / right knee bent. Complete this exercise _______3___ times a day. Balance exercise This exercise builds your balance and strength control of your arch to help take pressure off your plantar fascia. Single leg stand If this exercise is too easy, you can try it with your eyes closed or while standing on a pillow. 1. Without shoes, stand near a railing or in a doorway. You may hold on to the railing or door frame as needed. 2. Stand on your left / right foot. Keep your big toe down on the floor and try to keep your arch lifted. Do not let your foot roll inward. 3. Hold this position for ___15_______ seconds. Repeat _____3_____ times. Complete this exercise _____3_____ times a day. This information is not intended to replace advice given to you by your health care provider. Make sure you discuss any questions you have with your health care provider. Document Revised: 08/04/2018 Document Reviewed: 02/09/2018 Elsevier Patient Education  [place]. Plantar Fasciitis  Plantar fasciitis is a painful foot condition that affects the heel. It occurs when the band of tissue that connects the toes to the heel bone (plantar fascia) becomes irritated. This can happen as the result of  exercising too much or doing other repetitive activities (overuse injury). The pain from plantar fasciitis can range from mild irritation to severe pain that makes it difficult to walk or move. The pain is usually worse in the morning after sleeping, or after sitting or lying down for a while. Pain may also be worse after long periods of walking or standing. What are the causes? This condition may be caused by:  Standing for long periods of time.  Wearing shoes that do  not have good arch support.  Doing activities that put stress on joints (high-impact activities), including running, aerobics, and ballet.  Being overweight.  An abnormal way of walking (gait).  Tight muscles in the back of your lower leg (calf).  High arches in your feet.  Starting a new athletic activity. What are the signs or symptoms? The main symptom of this condition is heel pain. Pain may:  Be worse with first steps after a time of rest, especially in the morning after sleeping or after you have been sitting or lying down for a while.  Be worse after long periods of standing still.  Decrease after 30-45 minutes of activity, such as gentle walking. How is this diagnosed? This condition may be diagnosed based on your medical history and your symptoms. Your health care provider may ask questions about your activity level. Your health care provider will do a physical exam to check for:  A tender area on the bottom of your foot.  A high arch in your foot.  Pain when you move your foot.  Difficulty moving your foot. You may have imaging tests to confirm the diagnosis, such as:  X-rays.  Ultrasound.  MRI. How is this treated? Treatment for plantar fasciitis depends on how severe your condition is. Treatment may include:  Rest, ice, applying pressure (compression), and raising the affected foot (elevation). This may be called RICE therapy. Your health care provider may recommend RICE therapy along with  over-the-counter pain medicines to manage your pain.  Exercises to stretch your calves and your plantar fascia.  A splint that holds your foot in a stretched, upward position while you sleep (night splint).  Physical therapy to relieve symptoms and prevent problems in the future.  Injections of steroid medicine (cortisone) to relieve pain and inflammation.  Stimulating your plantar fascia with electrical impulses (extracorporeal shock wave therapy). This is usually the last treatment option before surgery.  Surgery, if other treatments have not worked after 12 months. Follow these instructions at home:  Managing pain, stiffness, and swelling  If directed, put ice on the painful area: ? Put ice in a plastic bag, or use a frozen bottle of water. ? Place a towel between your skin and the bag or bottle. ? Roll the bottom of your foot over the bag or bottle. ? Do this for 20 minutes, 2-3 times a day.  Wear athletic shoes that have air-sole or gel-sole cushions, or try wearing soft shoe inserts that are designed for plantar fasciitis.  Raise (elevate) your foot above the level of your heart while you are sitting or lying down. Activity  Avoid activities that cause pain. Ask your health care provider what activities are safe for you.  Do physical therapy exercises and stretches as told by your health care provider.  Try activities and forms of exercise that are easier on your joints (low-impact). Examples include swimming, water aerobics, and biking. General instructions  Take over-the-counter and prescription medicines only as told by your health care provider.  Wear a night splint while sleeping, if told by your health care provider. Loosen the splint if your toes tingle, become numb, or turn cold and blue.  Maintain a healthy weight, or work with your health care provider to lose weight as needed.  Keep all follow-up visits as told by your health care provider. This is  important. Contact a health care provider if you:  Have symptoms that do not go away after caring for yourself at home.  Have pain that gets worse.  Have pain that affects your ability to move or do your daily activities. Summary  Plantar fasciitis is a painful foot condition that affects the heel. It occurs when the band of tissue that connects the toes to the heel bone (plantar fascia) becomes irritated.  The main symptom of this condition is heel pain that may be worse after exercising too much or standing still for a long time.  Treatment varies, but it usually starts with rest, ice, compression, and elevation (RICE therapy) and over-the-counter medicines to manage pain. This information is not intended to replace advice given to you by your health care provider. Make sure you discuss any questions you have with your health care provider. Document Revised: 03/26/2017 Document Reviewed: 02/08/2017 Elsevier Patient Education  2020 [employer].

## 2020-03-08 ENCOUNTER — Ambulatory Visit: Payer: Self-pay | Admitting: *Deleted

## 2020-03-08 ENCOUNTER — Other Ambulatory Visit: Payer: Self-pay

## 2020-03-08 VITALS — BP 126/83 | HR 79

## 2020-03-08 DIAGNOSIS — I10 Essential (primary) hypertension: Secondary | ICD-10-CM

## 2020-03-08 NOTE — Progress Notes (Signed)
Home readings 117-120/68-75.

## 2020-03-15 IMAGING — US ULTRASOUND RIGHT BREAST LIMITED
1 series · 5 of 5 positions shown · non-contrast
Comparison: Previous exam(s).

CLINICAL DATA: 52-year-old patient presents for follow-up after a
probably benign mass in the right breast at [DATE] position.

EXAM:
DIGITAL DIAGNOSTIC RIGHT MAMMOGRAM WITH CAD AND TOMO
ULTRASOUND RIGHT BREAST

[Series 1: ultrasound right breast limited · 0.06mm/px · 5 of 5 slices shown]
[im 1/5]
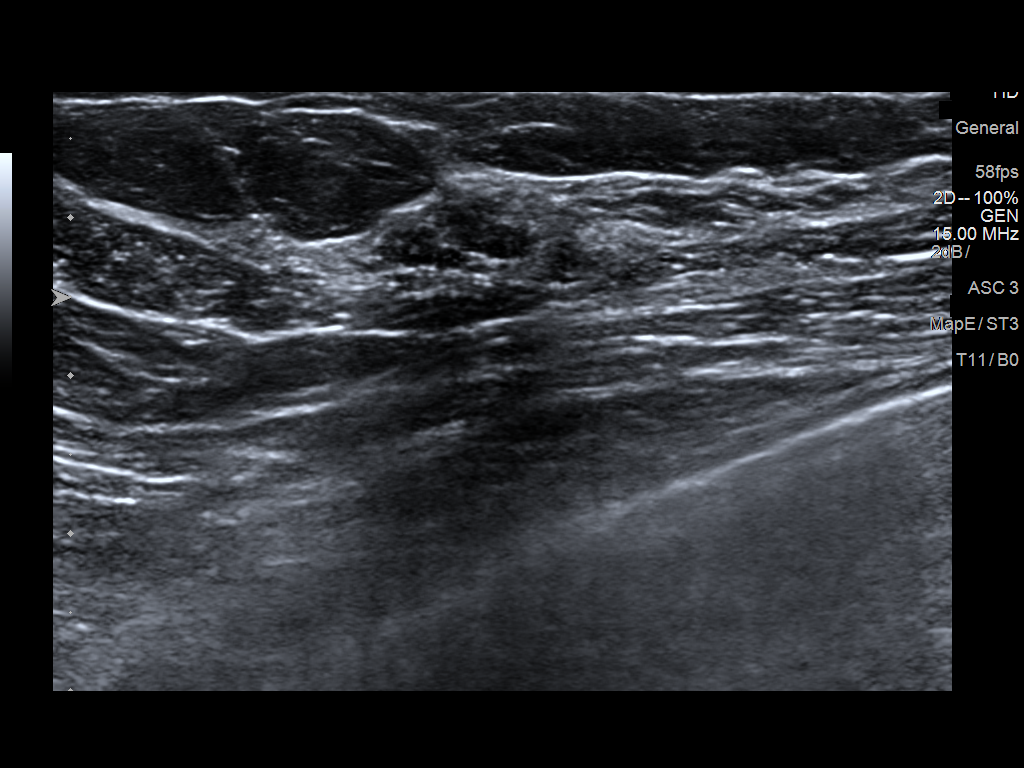
[im 2/5]
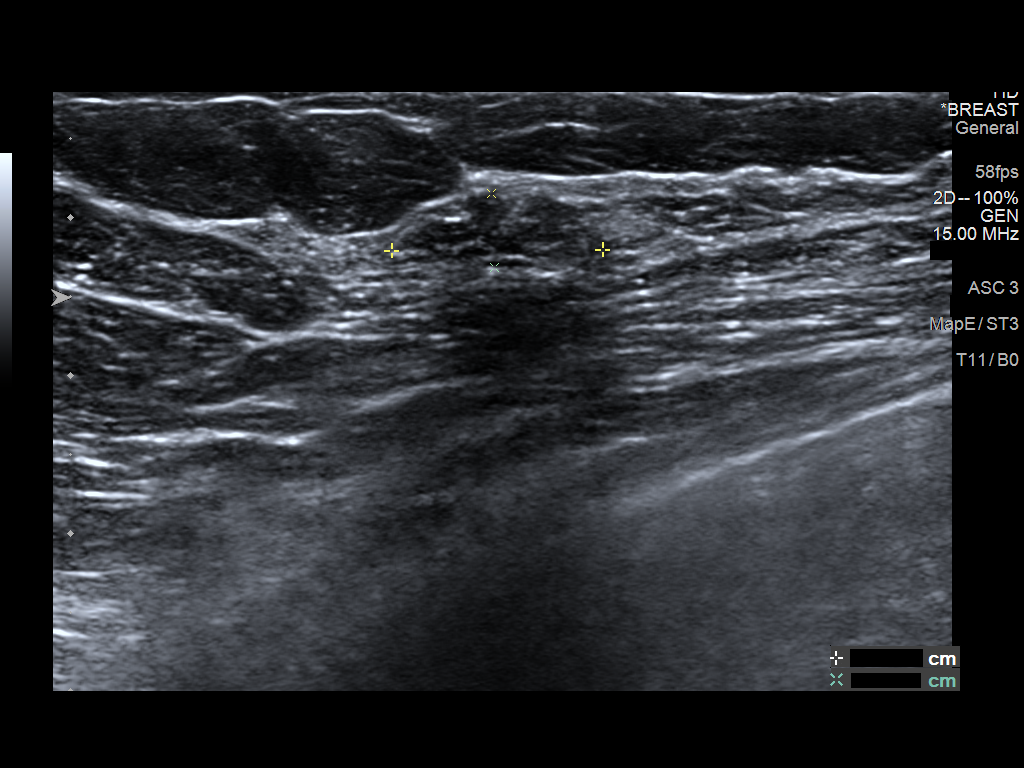
[im 3/5]
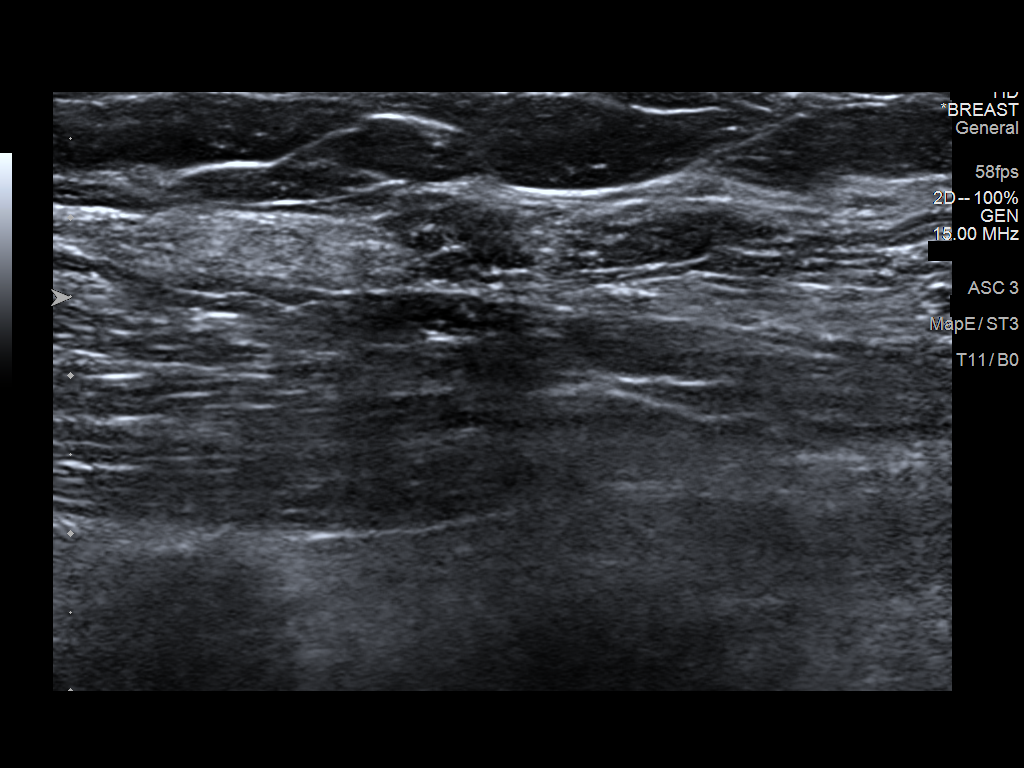
[im 4/5]
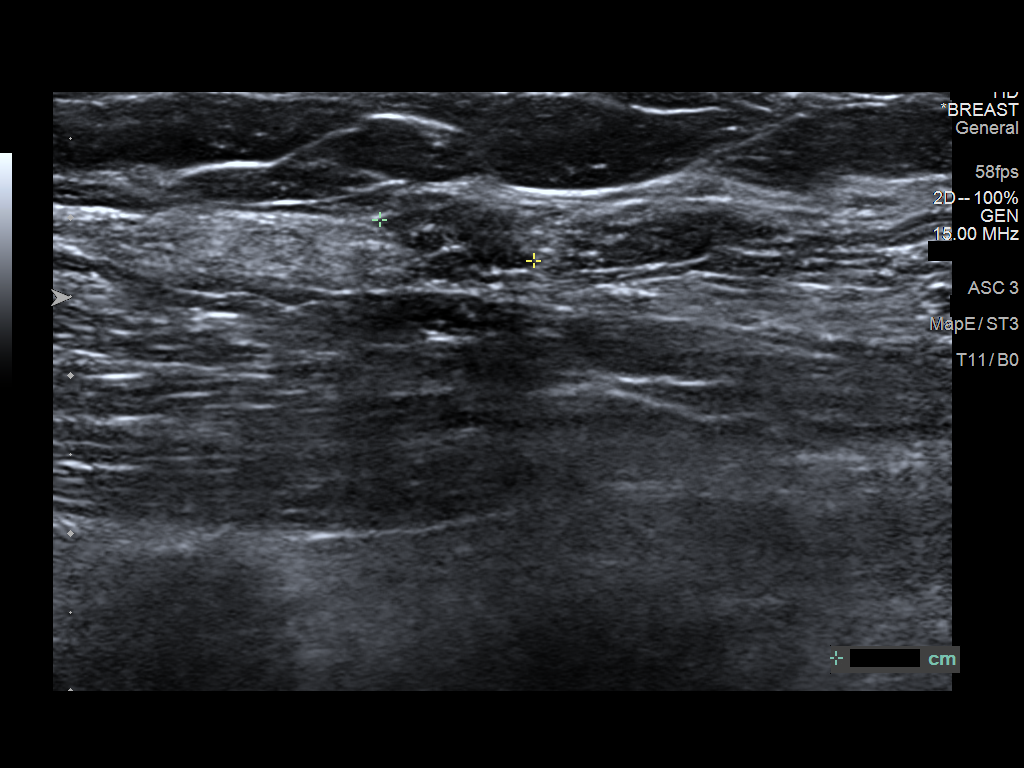
[im 5/5]
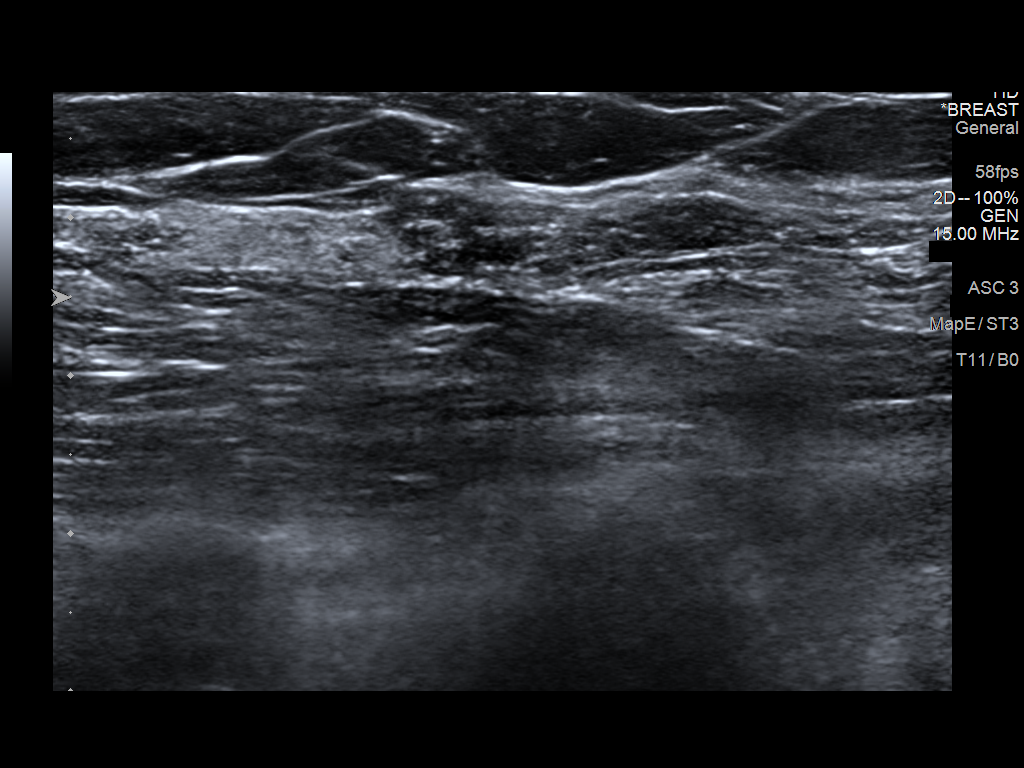

[5 of 5 positions shown; findings below may reference images not displayed]

ACR Breast Density Category c: The breast tissue is heterogeneously
dense, which may obscure small masses.
FINDINGS: Oval mass in the superior right breast appears mammographically
stable. No new or suspicious mass, architectural distortion, or
suspicious microcalcification is identified in the right breast.

Mammographic images were processed with CAD.

Targeted ultrasound is performed, showing an oval parallel
hypoechoic mass with internal echogenic bands at [DATE] position 11
cm from the nipple measuring 1.3 x 0.5 x 1.0 cm. This mass is
stable.
IMPRESSION: Stable probable fibroadenoma in the right breast.

RECOMMENDATION:
Bilateral diagnostic mammogram and right breast ultrasound is
recommended in November 2018.

I have discussed the findings and recommendations with the patient.
Results were also provided in writing at the conclusion of the
visit. If applicable, a reminder letter will be sent to the patient
regarding the next appointment.

BI-RADS CATEGORY  3: Probably benign.

## 2020-03-29 ENCOUNTER — Encounter: Payer: Self-pay | Admitting: Registered Nurse

## 2020-03-29 ENCOUNTER — Telehealth: Payer: Self-pay | Admitting: Registered Nurse

## 2020-03-29 DIAGNOSIS — I10 Essential (primary) hypertension: Secondary | ICD-10-CM

## 2020-03-29 DIAGNOSIS — M7989 Other specified soft tissue disorders: Secondary | ICD-10-CM | POA: Insufficient documentation

## 2020-03-29 DIAGNOSIS — M722 Plantar fascial fibromatosis: Secondary | ICD-10-CM

## 2020-03-29 MED ORDER — HYDROCHLOROTHIAZIDE 12.5 MG PO TABS: 12.5000 mg | ORAL_TABLET | Freq: Every day | ORAL | 0 refills | Status: DC

## 2020-03-29 NOTE — Telephone Encounter (Signed)
Received epic reminder BMET overdue.  Patient contacted via telephone stated she has been taking hydrochlorothiazide 12.5mg  po daily (forgetting sometimes) has 3 pills left needs refill.  Discussed nonfasting blood draw with RN Hildred Alamin 12/06 ensure hydrated before seeing RN Hildred Alamin.  Patient still having heel pain only one foot bought heel cups, wearing socks with spandex/compression, not going barefoot wearing house slippers and sketchers at work now instead of uggs.  Leg swelling improved.  Patient requested refill of motrin 800mg  po TID discussed will dispense from [facility] for her next week along with hydrochlorothiazide.  Patient has been applying foot cream but not helping pain.  Discussed may need imaging/follow up with podiatry as sometimes steroid injection needed also that we cannot perform in [place] [facility] for foot pain.  Patient agreed with plan of care, verbalized understanding instructions and had no further questions at this time.

## 2020-04-02 ENCOUNTER — Encounter: Payer: Self-pay | Admitting: Registered Nurse

## 2020-04-02 ENCOUNTER — Telehealth: Payer: Self-pay | Admitting: Registered Nurse

## 2020-04-02 ENCOUNTER — Ambulatory Visit: Payer: Self-pay | Admitting: *Deleted

## 2020-04-02 ENCOUNTER — Other Ambulatory Visit: Payer: Self-pay

## 2020-04-02 ENCOUNTER — Other Ambulatory Visit: Payer: Self-pay | Admitting: Registered Nurse

## 2020-04-02 VITALS — BP 118/74 | HR 70

## 2020-04-02 DIAGNOSIS — M7989 Other specified soft tissue disorders: Secondary | ICD-10-CM

## 2020-04-02 DIAGNOSIS — I10 Essential (primary) hypertension: Secondary | ICD-10-CM

## 2020-04-02 DIAGNOSIS — E876 Hypokalemia: Secondary | ICD-10-CM

## 2020-04-02 MED ORDER — HYDROCHLOROTHIAZIDE 12.5 MG PO TABS: 12.5000 mg | ORAL_TABLET | Freq: Every day | ORAL | 0 refills | Status: DC

## 2020-04-02 NOTE — Telephone Encounter (Signed)
BMP drawn today, nonfasting. BP check done. See separate RN encounter. Meds refilled through [facility].

## 2020-04-02 NOTE — Progress Notes (Signed)
Nonfasting BMP per NP Tina orders.

## 2020-04-02 NOTE — Telephone Encounter (Addendum)
Patient requested refill on ibuprofen and hydrochlorothiazide today.  nonfasting BMET drawn today with RN Hildred Alamin and BP 118/74 this am HR 70 much improved to prior to starting hydrochlorothiazide 12.5mg  po daily #30 RF0 and ibuprofen 800mg  po TID prn pain #30 RF0 dispensed from [facility] to patient today.  Follow up with PCM and to have another BP check in 2 weeks.   Will need new Rx from [facility] for ibuprofen for next fill all refills used. Leg swelling improved.  Lab results should be available tomorrow clinic closed I will call if anything urgent otherwise follow up Thursday with RN [facility] for test results.  Patient verbalized understanding information/instructions, agreed with plan of care and had no further questions at this time.

## 2020-04-03 LAB — BASIC METABOLIC PANEL
BUN/Creatinine Ratio: 16 (ref 9–23)
BUN: 12 mg/dL (ref 6–24)
CO2: 27 mmol/L (ref 20–29)
Calcium: 9 mg/dL (ref 8.7–10.2)
Chloride: 101 mmol/L (ref 96–106)
Creatinine, Ser: 0.73 mg/dL (ref 0.57–1.00)
GFR calc Af Amer: 107 mL/min/{1.73_m2} (ref >59)
GFR calc non Af Amer: 93 mL/min/{1.73_m2} (ref >59)
Glucose: 81 mg/dL (ref 65–99)
Potassium: 3.4 mmol/L — ABNORMAL LOW (ref 3.5–5.2)
Sodium: 143 mmol/L (ref 134–144)

## 2020-04-04 MED ORDER — POTASSIUM CHLORIDE CRYS ER 20 MEQ PO TBCR: 20.0000 meq | EXTENDED_RELEASE_TABLET | Freq: Every day | ORAL | 0 refills | Status: DC

## 2020-04-04 NOTE — Telephone Encounter (Signed)
Patient seen in workcenter, discussed bmet nonfasting results, given [facility]care handouts on hypo/hyperkalemia and dispensed potassium chloride 49meg po daily #30 RF0 to her to start with meal on days she is taking hydrochlorothiazide.  Leg swelling improved but not completely resolved. Feeling well denied side effects or having to urinate more frequently.   BP with lab draw improved.  Will follow up in 1 week for repeat BP after starting potassium.  Discussed with patient to notify us if nausea/vomiting/diarrhea/not tolerating potassium supplement.  Patient verbalized understanding information/instructions, agreed with plan of care and had no further questions at this time.

## 2020-04-04 NOTE — Patient Instructions (Signed)
Take potassium chloride 77meq one tab by mouth daily when taking hydrochlorothiazide  Hypokalemia Hypokalemia means that the amount of potassium in the blood is lower than normal. Potassium is a chemical (electrolyte) that helps regulate the amount of fluid in the body. It also stimulates muscle tightening (contraction) and helps nerves work properly. Normally, most of the body's potassium is inside cells, and only a very small amount is in the blood. Because the amount in the blood is so small, minor changes to potassium levels in the blood can be life-threatening. What are the causes? This condition may be caused by:  Antibiotic medicine.  Diarrhea or vomiting. Taking too much of a medicine that helps you have a bowel movement (laxative) can cause diarrhea and lead to hypokalemia.  Chronic kidney disease (CKD).  Medicines that help the body get rid of excess fluid (diuretics).  Eating disorders, such as bulimia.  Low magnesium levels in the body.  Sweating a lot. What are the signs or symptoms? Symptoms of this condition include:  Weakness.  Constipation.  Fatigue.  Muscle cramps.  Mental confusion.  Skipped heartbeats or irregular heartbeat (palpitations).  Tingling or numbness. How is this diagnosed? This condition is diagnosed with a blood test. How is this treated? This condition may be treated by:  Taking potassium supplements by mouth.  Adjusting the medicines that you take.  Eating more foods that contain a lot of potassium. If your potassium level is very low, you may need to get potassium through an IV and be monitored in the hospital. Follow these instructions at home:   Take over-the-counter and prescription medicines only as told by your health care provider. This includes vitamins and supplements.  Eat a healthy diet. A healthy diet includes fresh fruits and vegetables, whole grains, healthy fats, and lean proteins.  If instructed, eat more foods  that contain a lot of potassium. This includes: ? Nuts, such as peanuts and pistachios. ? Seeds, such as sunflower seeds and pumpkin seeds. ? Peas, lentils, and lima beans. ? Whole grain and bran cereals and breads. ? Fresh fruits and vegetables, such as apricots, avocado, bananas, cantaloupe, kiwi, oranges, tomatoes, asparagus, and potatoes. ? Orange juice. ? Tomato juice. ? Red meats. ? Yogurt.  Keep all follow-up visits as told by your health care provider. This is important. Contact a health care provider if you:  Have weakness that gets worse.  Feel your heart pounding or racing.  Vomit.  Have diarrhea.  Have diabetes (diabetes mellitus) and you have trouble keeping your blood sugar (glucose) in your target range. Get help right away if you:  Have chest pain.  Have shortness of breath.  Have vomiting or diarrhea that lasts for more than 2 days.  Faint. Summary  Hypokalemia means that the amount of potassium in the blood is lower than normal.  This condition is diagnosed with a blood test.  Hypokalemia may be treated by taking potassium supplements, adjusting the medicines that you take, or eating more foods that are high in potassium.  If your potassium level is very low, you may need to get potassium through an IV and be monitored in the hospital. This information is not intended to replace advice given to you by your health care provider. Make sure you discuss any questions you have with your health care provider. Document Revised: 11/24/2017 Document Reviewed: 11/24/2017 Elsevier Patient Education  [place].

## 2020-04-04 NOTE — Telephone Encounter (Signed)
Patient notified BMET results and that potassium level slightly low 3.4 with hydrochlorothiazide use daily.  Start potassium chloride 96meq po daily when taking her hydrochlorothiazide.  Take with food and water.  Increase water intake as currently 32 oz per day only per patient.  I never noticed peeing any more than I usually did after starting hydrochlorothiazide.  Discussed kidney function normal.  Patient to follow up in 30 days for re-evaluation blood pressure/leg swelling sooner if not tolerating potassium chloride. Discussed signs and symptoms of hypo/hyperkalemia with patient. Exitcare handouts on hyperkalemia and hypokalemia printed and given to patient.  Patient verbalized understanding information/instructions, agreed with plan of care and had no further questions at this time.

## 2020-04-05 NOTE — Telephone Encounter (Signed)
BP check appt emailed to pt for 04/11/20 at 1000.

## 2020-04-07 NOTE — Telephone Encounter (Signed)
Noted BP appt scheduled with RN Hildred Alamin

## 2020-04-11 ENCOUNTER — Other Ambulatory Visit: Payer: Self-pay

## 2020-04-11 ENCOUNTER — Ambulatory Visit: Payer: Self-pay | Admitting: *Deleted

## 2020-04-11 VITALS — BP 107/78 | HR 74

## 2020-04-11 DIAGNOSIS — M7989 Other specified soft tissue disorders: Secondary | ICD-10-CM

## 2020-04-11 DIAGNOSIS — I10 Essential (primary) hypertension: Secondary | ICD-10-CM

## 2020-04-11 NOTE — Progress Notes (Signed)
Routine BP check after starting HCTZ. BP well controlled and denies any side effects. Started K+ supplement. She is trying to increase water intake still.

## 2020-04-16 ENCOUNTER — Other Ambulatory Visit: Payer: Self-pay

## 2020-04-16 NOTE — Telephone Encounter (Signed)
Medication refill request: Ibuprofen Last AEX:  11/03/19 JJ Next AEX: 11/07/20 Last MMG (if hormonal medication request): n/a Refill authorized: today, please advise  Spoke with patient regarding Levothyroxine and advised that a year supply was sent to pharmacy 11/03/19 #90 w/3 refills.

## 2020-04-16 NOTE — Telephone Encounter (Signed)
Patient is calling to check status of refills. Patient states she is supposed to have a year supply.

## 2020-04-16 NOTE — Telephone Encounter (Signed)
Patient requesting refill on levothyroxine and ibuprofen. [facility] [phone].

## 2020-04-17 MED ORDER — IBUPROFEN 800 MG PO TABS: 800.0000 mg | ORAL_TABLET | Freq: Three times a day (TID) | ORAL | 1 refills | Status: DC | PRN

## 2020-05-03 ENCOUNTER — Other Ambulatory Visit: Payer: Self-pay

## 2020-05-03 ENCOUNTER — Ambulatory Visit: Payer: Self-pay | Admitting: *Deleted

## 2020-05-03 VITALS — BP 131/93 | HR 78

## 2020-05-03 DIAGNOSIS — M7989 Other specified soft tissue disorders: Secondary | ICD-10-CM

## 2020-05-03 DIAGNOSIS — I10 Essential (primary) hypertension: Secondary | ICD-10-CM

## 2020-05-03 NOTE — Progress Notes (Signed)
BP check one month after restarting HCTZ and adding K+. Reports home BP check a few days ago was 107/63. Higher here today. Reports leg swelling about the same as one month ago. R heel pain also unchanged despite heel cups and exercises. Wondering if she needs to proceed with podiatry referral.   Out of HCTZ 2 days ago. Still has K+ as she sometimes skips days because the pills are hard to swallow. Unsure how much left. Refilling both thru [facility].

## 2020-05-30 NOTE — Telephone Encounter (Signed)
Attempted to locate patient in warehouse today unsuccessful.  Patient due repeat BP/weight check and follow up nonfasting BMET due to hypokalemia mild Dec 2021 secondary to hydrochlorothiazide use.  RN Hildred Alamin left patient message to call clinic to schedule repeat nonfasting BMET/weight and BP check.

## 2020-07-04 NOTE — Telephone Encounter (Signed)
Patient contacted via telephone reminded patient BMET and BP check overdue.  She notified me she has appt with RN Hildred Alamin tomorrow for Be Well appt.  Be Well includes CMET and BP/weight.  Discussed with patient I would contact her this weekend with results.  Patient verbalized understanding information and had no further questions at this time.

## 2020-07-05 ENCOUNTER — Ambulatory Visit: Payer: Self-pay | Admitting: *Deleted

## 2020-07-05 ENCOUNTER — Other Ambulatory Visit: Payer: Self-pay

## 2020-07-05 VITALS — BP 123/75 | HR 74 | Ht 63.0 in | Wt 179.0 lb

## 2020-07-05 DIAGNOSIS — Z0000 Encounter for general adult medical examination without abnormal findings: Secondary | ICD-10-CM

## 2020-07-05 NOTE — Progress Notes (Signed)
Be Well insurance premium discount evaluation: Labs Drawn. Replacements ROI form signed. Tobacco Free Attestation form signed.  Forms placed in paper chart.  

## 2020-07-06 ENCOUNTER — Other Ambulatory Visit: Payer: Self-pay | Admitting: Registered Nurse

## 2020-07-06 LAB — CMP12+LP+TP+TSH+6AC+CBC/D/PLT
ALT: 11 IU/L (ref 0–32)
AST: 17 IU/L (ref 0–40)
Albumin/Globulin Ratio: 1.5 (ref 1.2–2.2)
Albumin: 4.5 g/dL (ref 3.8–4.9)
Alkaline Phosphatase: 78 IU/L (ref 44–121)
BUN/Creatinine Ratio: 16 (ref 9–23)
BUN: 11 mg/dL (ref 6–24)
Basophils Absolute: 0 10*3/uL (ref 0.0–0.2)
Basos: 0 %
Bilirubin Total: 0.4 mg/dL (ref 0.0–1.2)
Calcium: 9.5 mg/dL (ref 8.7–10.2)
Chloride: 97 mmol/L (ref 96–106)
Chol/HDL Ratio: 3 ratio (ref 0.0–4.4)
Cholesterol, Total: 167 mg/dL (ref 100–199)
Creatinine, Ser: 0.7 mg/dL (ref 0.57–1.00)
EOS (ABSOLUTE): 0.1 10*3/uL (ref 0.0–0.4)
Eos: 1 %
Estimated CHD Risk: <0.5 times avg. (ref 0.0–1.0)
Free Thyroxine Index: 2.4 (ref 1.2–4.9)
GGT: 21 IU/L (ref 0–60)
Globulin, Total: 3 g/dL (ref 1.5–4.5)
Glucose: 89 mg/dL (ref 65–99)
HDL: 55 mg/dL (ref >39)
Hematocrit: 31.2 % — ABNORMAL LOW (ref 34.0–46.6)
Hemoglobin: 11 g/dL — ABNORMAL LOW (ref 11.1–15.9)
Immature Grans (Abs): 0 10*3/uL (ref 0.0–0.1)
Immature Granulocytes: 0 %
Iron: 67 ug/dL (ref 27–159)
LDH: 175 IU/L (ref 119–226)
LDL Chol Calc (NIH): 104 mg/dL — ABNORMAL HIGH (ref 0–99)
Lymphocytes Absolute: 2.2 10*3/uL (ref 0.7–3.1)
Lymphs: 35 %
MCH: 32.8 pg (ref 26.6–33.0)
MCHC: 35.3 g/dL (ref 31.5–35.7)
MCV: 93 fL (ref 79–97)
Monocytes Absolute: 0.4 10*3/uL (ref 0.1–0.9)
Monocytes: 7 %
Neutrophils Absolute: 3.6 10*3/uL (ref 1.4–7.0)
Neutrophils: 57 %
Phosphorus: 4 mg/dL (ref 3.0–4.3)
Platelets: 341 10*3/uL (ref 150–450)
Potassium: 3.8 mmol/L (ref 3.5–5.2)
RBC: 3.35 x10E6/uL — ABNORMAL LOW (ref 3.77–5.28)
RDW: 12.5 % (ref 11.7–15.4)
Sodium: 140 mmol/L (ref 134–144)
T3 Uptake Ratio: 25 % (ref 24–39)
T4, Total: 9.7 ug/dL (ref 4.5–12.0)
TSH: 0.551 u[IU]/mL (ref 0.450–4.500)
Total Protein: 7.5 g/dL (ref 6.0–8.5)
Triglycerides: 35 mg/dL (ref 0–149)
Uric Acid: 6 mg/dL (ref 3.0–7.2)
VLDL Cholesterol Cal: 8 mg/dL (ref 5–40)
WBC: 6.3 10*3/uL (ref 3.4–10.8)
eGFR: 102 mL/min/{1.73_m2} (ref >59)

## 2020-07-06 LAB — HGB A1C W/O EAG: Hgb A1c MFr Bld: 5.4 % (ref 4.8–5.6)

## 2020-07-08 NOTE — Progress Notes (Signed)
Reviewed RN Hildred Alamin note patient denied menses.  Taking potassium every 2-3 days not daily.  Last colonoscopy 2017.  Recommend repeat CBC in 1 month sooner if new symptoms especially fatigue, shortness of breath, dyspnea with exertion.

## 2020-07-08 NOTE — Progress Notes (Signed)
Spoke with pt by phone. She reports no menses anymore. No other sources of bleeding. Fibroids have not been bothering her recently. No oral contraceptives or hormones. She has been off her multivitamin for at least more than a month. Will restart this. Potassium supplement is difficult for her to swallow so she is only taking every 2-3 days. No refill needed at this time. Colonoscopy UTD, performed in 2017. She was told no return for 10 years. Diet and exercise recommendations discussed re: anemia, LDL, health maintenance. Routine f/u with pcp. Results routed to pcp per pt request. No further questions/concerns.

## 2020-07-09 LAB — SPECIMEN STATUS REPORT

## 2020-07-09 LAB — VITAMIN D 25 HYDROXY (VIT D DEFICIENCY, FRACTURES): Vit D, 25-Hydroxy: 24.4 ng/mL — ABNORMAL LOW (ref 30.0–100.0)

## 2020-07-09 NOTE — Progress Notes (Signed)
Noted RN Hildred Alamin left message patient no answer today

## 2020-07-09 NOTE — Progress Notes (Signed)
Called pt to discuss Vit D. No answer. LVMRCB.

## 2020-07-31 IMAGING — MG DIGITAL DIAGNOSTIC UNILATERAL RIGHT MAMMOGRAM WITH TOMO AND CAD
4 series · 4 of 12 positions shown · non-contrast
Comparison: Previous exam(s).

CLINICAL DATA: 52-year-old patient presents for follow-up after a
probably benign mass in the right breast at [DATE] position.

EXAM:
DIGITAL DIAGNOSTIC RIGHT MAMMOGRAM WITH CAD AND TOMO
ULTRASOUND RIGHT BREAST

[R MLO synth-2D]
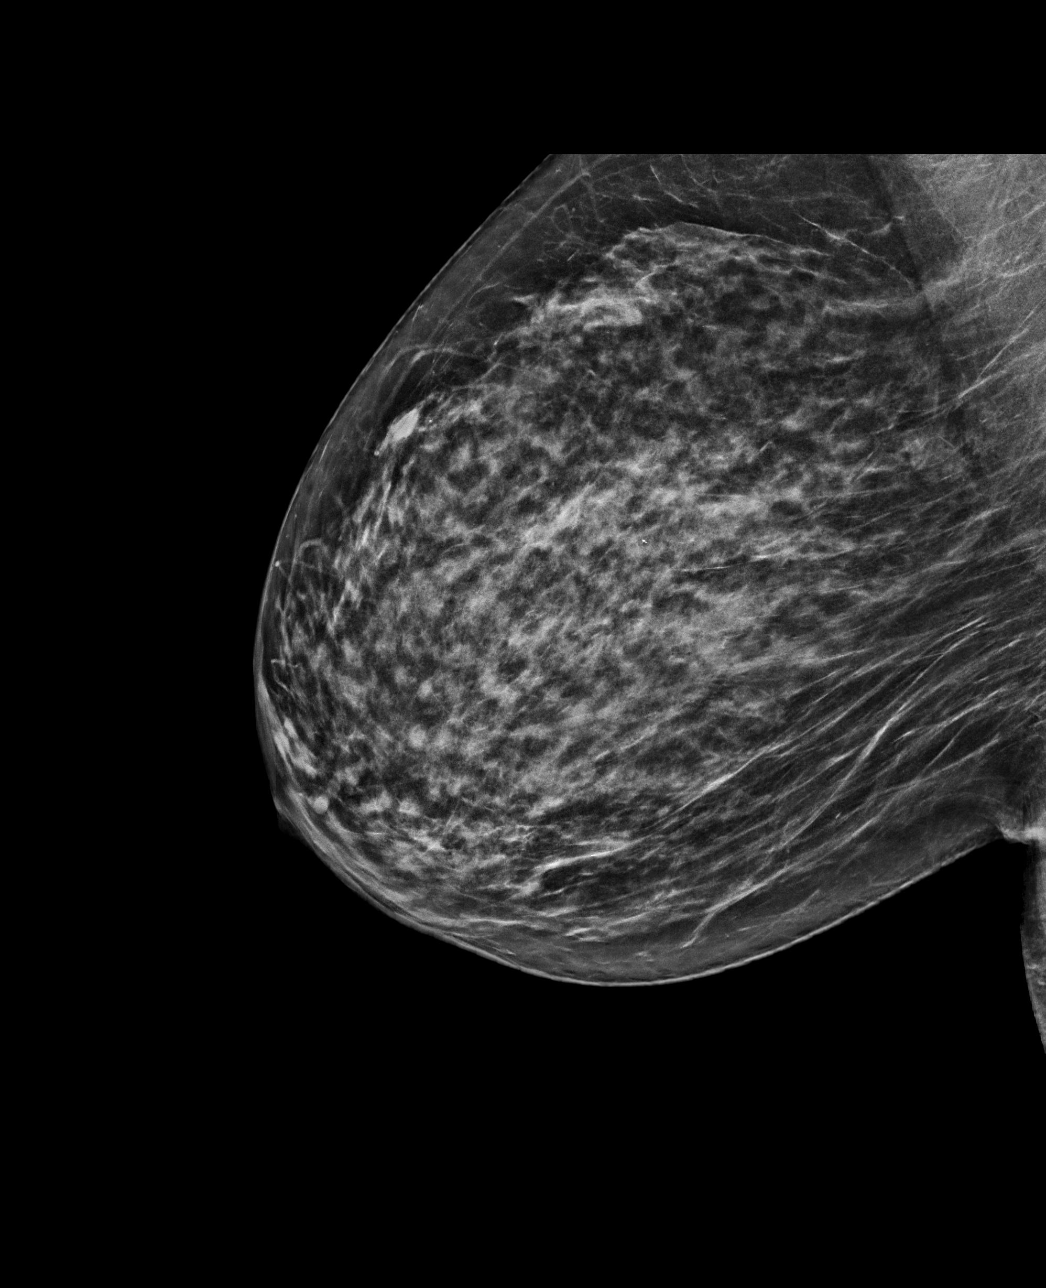

[R CC synth-2D]
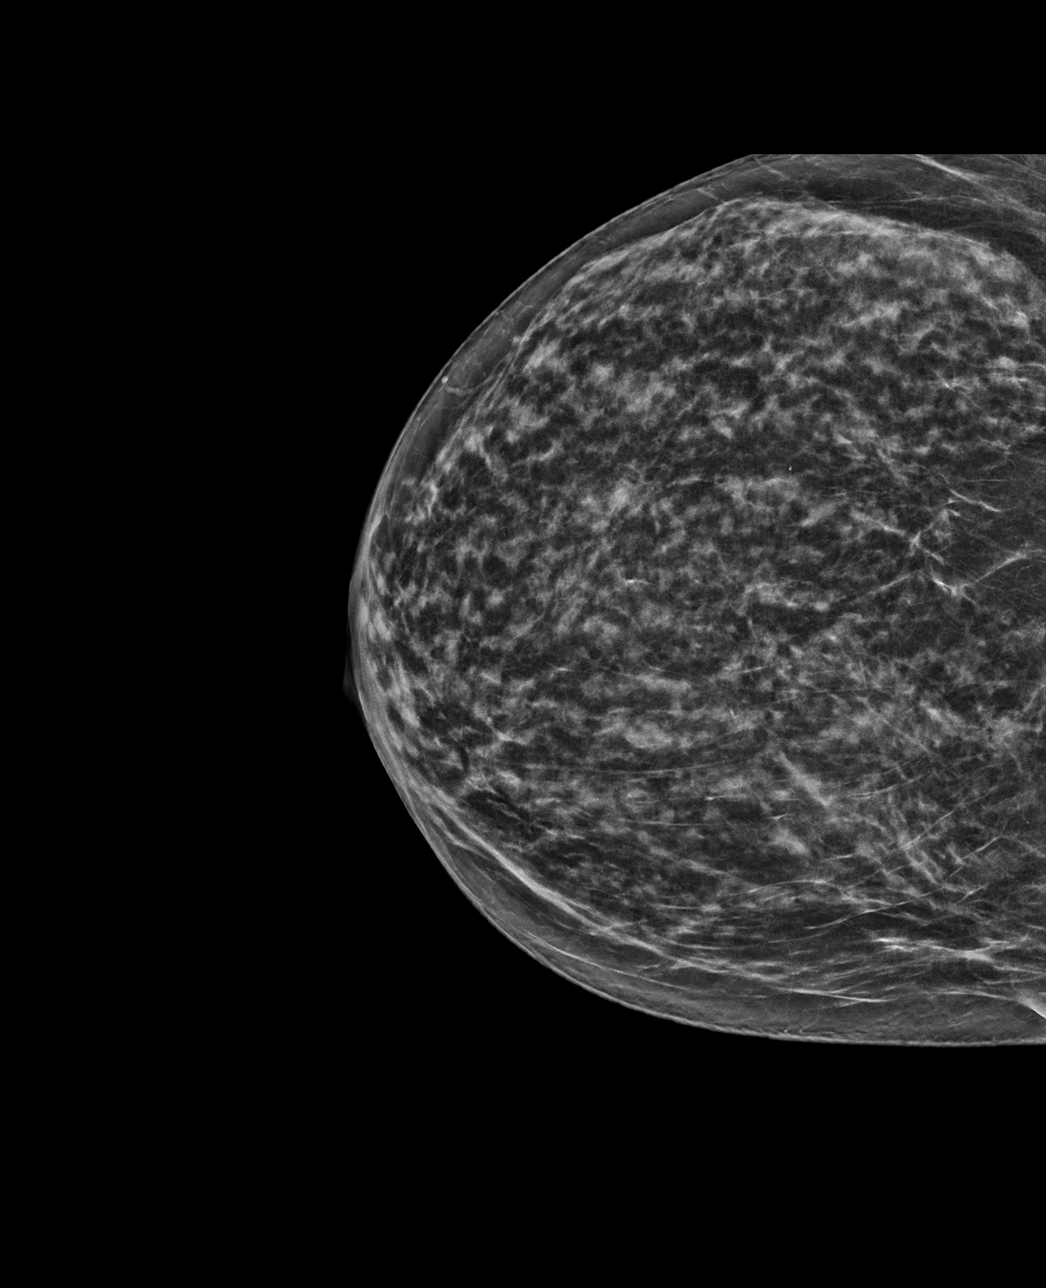

[R CC tomo · tomo slice 31/61.0]
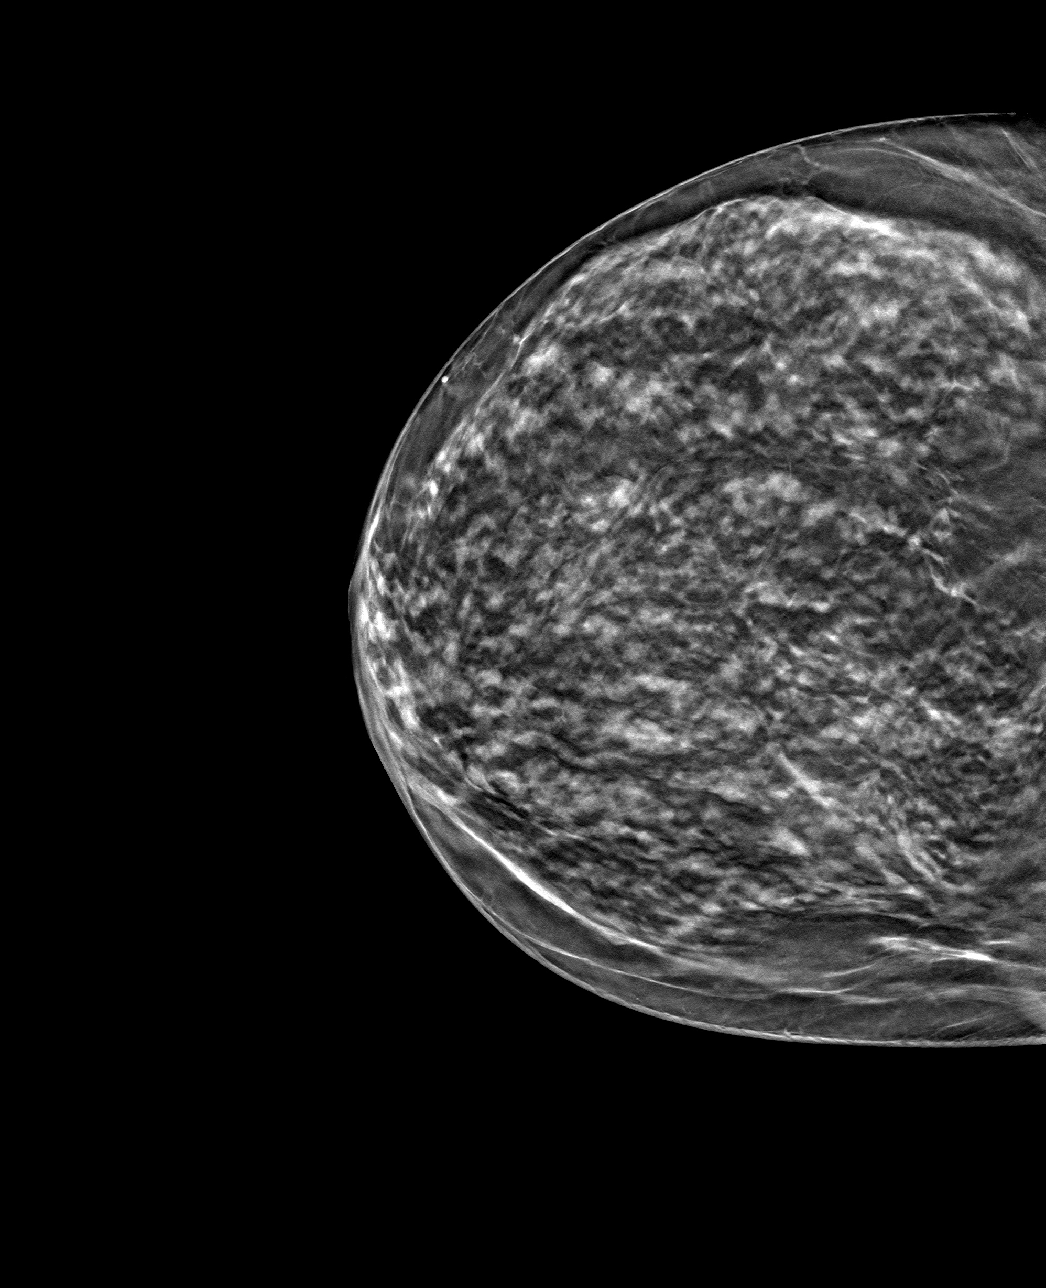

[R MLO tomo · tomo slice 33/65.0]
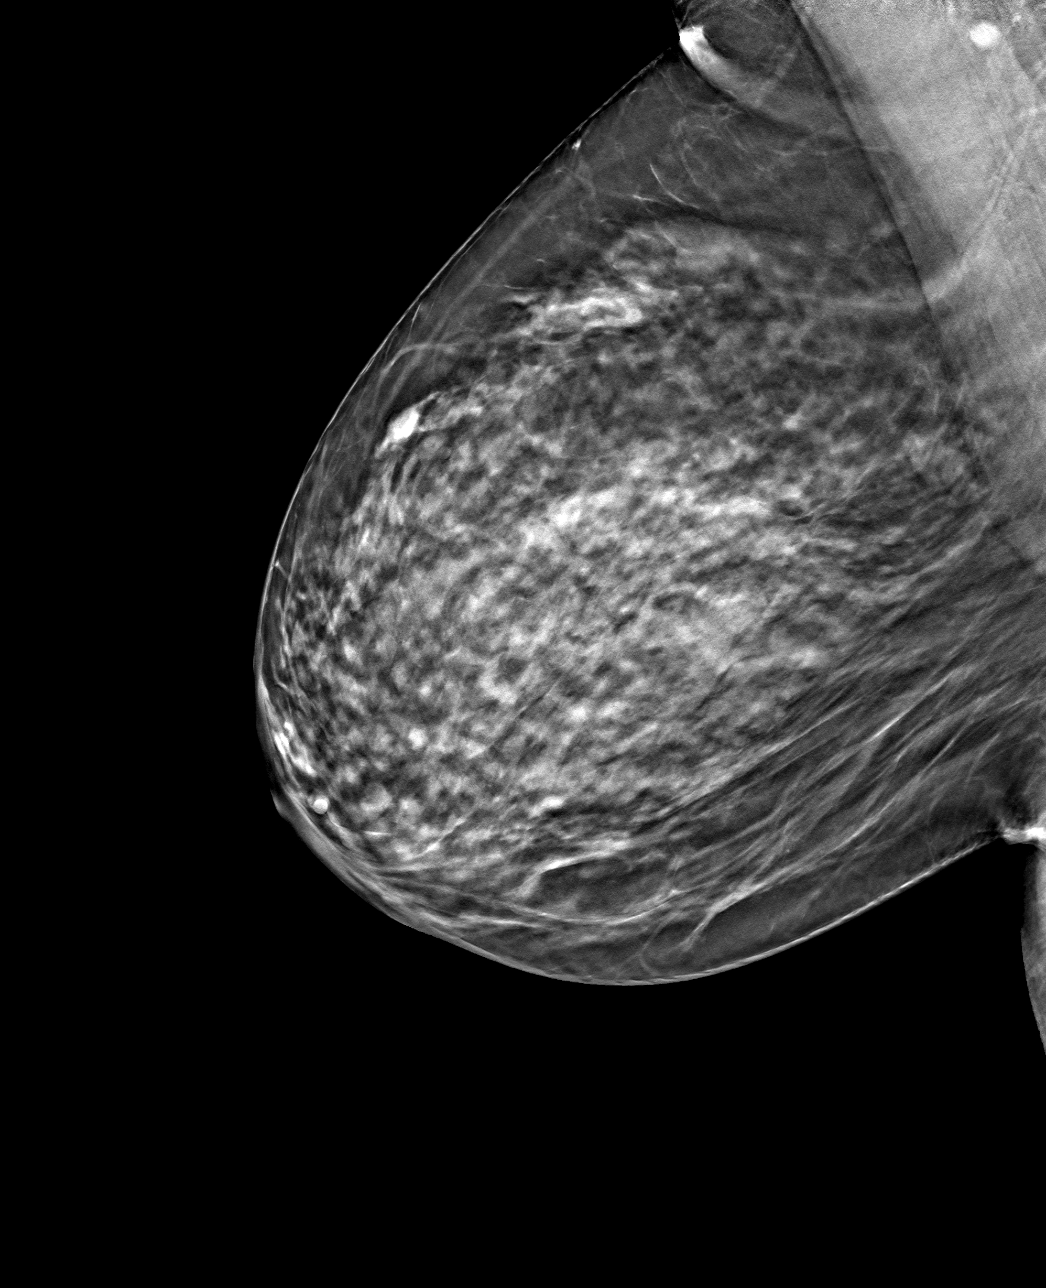

[4 of 12 positions shown; findings below may reference images not displayed]

ACR Breast Density Category c: The breast tissue is heterogeneously
dense, which may obscure small masses.
FINDINGS: Oval mass in the superior right breast appears mammographically
stable. No new or suspicious mass, architectural distortion, or
suspicious microcalcification is identified in the right breast.

Mammographic images were processed with CAD.

Targeted ultrasound is performed, showing an oval parallel
hypoechoic mass with internal echogenic bands at [DATE] position 11
cm from the nipple measuring 1.3 x 0.5 x 1.0 cm. This mass is
stable.
IMPRESSION: Stable probable fibroadenoma in the right breast.

RECOMMENDATION:
Bilateral diagnostic mammogram and right breast ultrasound is
recommended in November 2018.

I have discussed the findings and recommendations with the patient.
Results were also provided in writing at the conclusion of the
visit. If applicable, a reminder letter will be sent to the patient
regarding the next appointment.

BI-RADS CATEGORY  3: Probably benign.

## 2020-09-12 ENCOUNTER — Ambulatory Visit: Payer: Self-pay | Admitting: *Deleted

## 2020-09-12 ENCOUNTER — Encounter: Payer: Self-pay | Admitting: Registered Nurse

## 2020-09-12 ENCOUNTER — Other Ambulatory Visit: Payer: Self-pay

## 2020-09-12 ENCOUNTER — Telehealth: Payer: Self-pay | Admitting: Registered Nurse

## 2020-09-12 VITALS — BP 138/80 | HR 74 | Wt 185.0 lb

## 2020-09-12 DIAGNOSIS — I10 Essential (primary) hypertension: Secondary | ICD-10-CM

## 2020-09-12 DIAGNOSIS — M7989 Other specified soft tissue disorders: Secondary | ICD-10-CM

## 2020-09-12 MED ORDER — POTASSIUM CHLORIDE CRYS ER 20 MEQ PO TBCR: 20.0000 meq | EXTENDED_RELEASE_TABLET | Freq: Every day | ORAL | 0 refills | Status: DC

## 2020-09-12 MED ORDER — HYDROCHLOROTHIAZIDE 12.5 MG PO TABS: 12.5000 mg | ORAL_TABLET | Freq: Every day | ORAL | 0 refills | Status: DC

## 2020-09-12 NOTE — Progress Notes (Signed)
Has been out of HCTZ and K+ for about one month. BP elevated some again and weight up 6# from March. Meds refilled today.

## 2020-09-12 NOTE — Telephone Encounter (Signed)
Patient reported ran out of hydrochlorothiazide 12.80m po daily 3 weeks ago and now legs swelling again.  See RN note today weight/BP check stable.  Filled medications from [facility] to patient except only 60 day supply available of potassium chloride and will fill additional 30 days when shipment received.  Labs fasting completed for Be Well 07/05/2020 BP 123/75 weight 179lbs at that time when on medication.  Patient to have annual follow up with PCM and bring new rx to clinic.  Bridge refilled today 90 days supply.  Follow up in 1 month for repeat BP and weight check with RN HHildred Alamin  Patient verbalized understanding information/instructions, agreed with plan of care and had no further questions at this time.  Last [facility] fill 05/03/2020.    Results for Dawn Trevino, Dawn Trevino(MRN [id number] as of 09/12/2020 12:57  Ref. Range 07/05/2020 09:38  Sodium Latest Ref Range: 134 - 144 mmol/L 140  Potassium Latest Ref Range: 3.5 - 5.2 mmol/L 3.8  Chloride Latest Ref Range: 96 - 106 mmol/L 97  Glucose Latest Ref Range: 65 - 99 mg/dL 89  BUN Latest Ref Range: 6 - 24 mg/dL 11  Creatinine Latest Ref Range: 0.57 - 1.00 mg/dL 0.70  Calcium Latest Ref Range: 8.7 - 10.2 mg/dL 9.5  BUN/Creatinine Ratio Latest Ref Range: 9 - 23  16  eGFR Latest Ref Range: >59 mL/min/1.73 102  Phosphorus Latest Ref Range: 3.0 - 4.3 mg/dL 4.0  Alkaline Phosphatase Latest Ref Range: 44 - 121 IU/L 78  Albumin Latest Ref Range: 3.8 - 4.9 g/dL 4.5  Albumin/Globulin Ratio Latest Ref Range: 1.2 - 2.2  1.5  Uric Acid Latest Ref Range: 3.0 - 7.2 mg/dL 6.0  AST Latest Ref Range: 0 - 40 IU/L 17  ALT Latest Ref Range: 0 - 32 IU/L 11  Total Protein Latest Ref Range: 6.0 - 8.5 g/dL 7.5  Total Bilirubin Latest Ref Range: 0.0 - 1.2 mg/dL 0.4  GGT Latest Ref Range: 0 - 60 IU/L 21  Estimated CHD Risk Latest Ref Range: 0.0 - 1.0 times avg. < 0.5  LDH Latest Ref Range: 119 - 226 IU/L 175  Total CHOL/HDL Ratio Latest Ref Range: 0.0 - 4.4 ratio 3.0   Cholesterol, Total Latest Ref Range: 100 - 199 mg/dL 167  HDL Cholesterol Latest Ref Range: >39 mg/dL 55  Triglycerides Latest Ref Range: 0 - 149 mg/dL 35  VLDL Cholesterol Cal Latest Ref Range: 5 - 40 mg/dL 8  LDL Chol Calc (NIH) Latest Ref Range: 0 - 99 mg/dL 104 (H)  Iron Latest Ref Range: 27 - 159 ug/dL 67  Vitamin D, 25-Hydroxy Latest Ref Range: 30.0 - 100.0 ng/mL 24.4 (L)  Globulin, Total Latest Ref Range: 1.5 - 4.5 g/dL 3.0  WBC Latest Ref Range: 3.4 - 10.8 x10E3/uL 6.3  RBC Latest Ref Range: 3.77 - 5.28 x10E6/uL 3.35 (L)  Hemoglobin Latest Ref Range: 11.1 - 15.9 g/dL 11.0 (L)  HCT Latest Ref Range: 34.0 - 46.6 % 31.2 (L)  MCV Latest Ref Range: 79 - 97 fL 93  MCH Latest Ref Range: 26.6 - 33.0 pg 32.8  MCHC Latest Ref Range: 31.5 - 35.7 g/dL 35.3  RDW Latest Ref Range: 11.7 - 15.4 % 12.5  Platelets Latest Ref Range: 150 - 450 x10E3/uL 341  Neutrophils Latest Ref Range: Not Estab. % 57  Immature Granulocytes Latest Ref Range: Not Estab. % 0  NEUT# Latest Ref Range: 1.4 - 7.0 x10E3/uL 3.6  Lymphocyte # Latest Ref Range: 0.7 - 3.1  x10E3/uL 2.2  Monocytes Absolute Latest Ref Range: 0.1 - 0.9 x10E3/uL 0.4  Basophils Absolute Latest Ref Range: 0.0 - 0.2 x10E3/uL 0.0  Immature Grans (Abs) Latest Ref Range: 0.0 - 0.1 x10E3/uL 0.0  Lymphs Latest Ref Range: Not Estab. % 35  Monocytes Latest Ref Range: Not Estab. % 7  Basos Latest Ref Range: Not Estab. % 0  Eos Latest Ref Range: Not Estab. % 1  EOS (ABSOLUTE) Latest Ref Range: 0.0 - 0.4 x10E3/uL 0.1  Hemoglobin A1C Latest Ref Range: 4.8 - 5.6 % 5.4  TSH Latest Ref Range: 0.450 - 4.500 uIU/mL 0.551  Thyroxine (T4) Latest Ref Range: 4.5 - 12.0 ug/dL 9.7  Free Thyroxine Index Latest Ref Range: 1.2 - 4.9  2.4  T3 Uptake Ratio Latest Ref Range: 24 - 39 % 25

## 2020-09-26 ENCOUNTER — Other Ambulatory Visit: Payer: Self-pay

## 2020-09-26 ENCOUNTER — Ambulatory Visit: Payer: Self-pay | Admitting: *Deleted

## 2020-09-26 VITALS — BP 144/83 | Wt 182.0 lb

## 2020-09-26 DIAGNOSIS — I10 Essential (primary) hypertension: Secondary | ICD-10-CM

## 2020-09-26 NOTE — Progress Notes (Signed)
Routine BP check per pt request for pcp.

## 2020-10-15 ENCOUNTER — Encounter: Payer: Self-pay | Admitting: Registered Nurse

## 2020-10-15 ENCOUNTER — Telehealth: Payer: Self-pay | Admitting: Registered Nurse

## 2020-10-15 MED ORDER — IBUPROFEN 800 MG PO TABS: 800.0000 mg | ORAL_TABLET | Freq: Three times a day (TID) | ORAL | 0 refills | Status: AC | PRN

## 2020-10-15 NOTE — Telephone Encounter (Signed)
Patient requested [facility] refill ibuprofen 800mg  po TID prn pain #30 RF0.  Rx from [facility] no refills left.  Bridge refill today  Per chart review last filled 04/02/2020.  Patient to get new Rx from [facility] next office visit.  Patient verbalized understanding information/instructions, agreed with plan of care and had no further questions at this time.

## 2020-11-07 ENCOUNTER — Ambulatory Visit: Payer: PRIVATE HEALTH INSURANCE | Admitting: Obstetrics and Gynecology

## 2020-11-20 NOTE — Progress Notes (Deleted)
55 y.o. G1P0001 Married Black or [place] American Not Hispanic or Latino female here for annual exam.      No LMP recorded. (Menstrual status: Perimenopausal).          Sexually active: {yes no:314532}  The current method of family planning is {contraception:315051}.    Exercising: {yes no:314532}  {types:19826} Smoker:  {YES V2345720  Health Maintenance: Pap:  10/18/18 neg HPV Neg   08-27-15 neg HPV HR neg History of abnormal Pap:  yes Cryosurgery over 30 years ago  MMG:  01/29/20 Density C Bi-rads 2 benign see epic  BMD:   none  Colonoscopy: 09/26/14 f/u 10 years  TDaP:  08/11/2011  Gardasil: n/a   reports that she has never smoked. She has never used smokeless tobacco. She reports current alcohol use. She reports that she does not use drugs.  Past Medical History:  Diagnosis Date   Abnormal Pap smear of cervix    30 yrs ago   Anemia    Bacterial vaginosis    Hematuria    Infertility, female    Thyroid nodule    LEFT 7/09    Past Surgical History:  Procedure Laterality Date   CRYOTHERAPY     for abnormal pap smear 82yr ago   [place]  abnormal pap - normal since   THYROID LOBECTOMY Right 2/95    Current Outpatient Medications  Medication Sig Dispense Refill   drospirenone-ethinyl estradiol (LORYNA) 3-0.02 MG tablet Take 1 tablet by mouth daily.     hydrochlorothiazide (HYDRODIURIL) 12.5 MG tablet Take 1 tablet (12.5 mg total) by mouth daily. 90 tablet 0   ibuprofen (ADVIL) 800 MG tablet Take 1 tablet (800 mg total) by mouth every 8 (eight) hours as needed for moderate pain. 30 tablet 0   levothyroxine (SYNTHROID) 75 MCG tablet Take 1 tablet (75 mcg total) by mouth daily before breakfast. 90 tablet 3   loratadine (CLARITIN) 10 MG tablet Take 1 tablet (10 mg total) by mouth daily. 30 tablet 11   Multiple Vitamin (MULTI-VITAMIN PO) Take by mouth daily.     potassium chloride SA (KLOR-CON) 20 MEQ tablet Take 1 tablet (20 mEq total) by mouth daily. 60  tablet 0   Probiotic Product (PROBIOTIC PO) Take by mouth.     No current facility-administered medications for this visit.    Family History  Problem Relation Age of Onset   Ovarian cancer Mother 675      ovarian cancer   Diabetes Mother    Prostate cancer Father 653      prostate cancer   Liver cancer Brother 558      liver cancer   Kidney disease Sister        both kidneys removed   Prostate cancer Brother     Review of Systems  Exam:   There were no vitals taken for this visit.  Weight change: '@WEIGHTCHANGE'$ @ Height:      Ht Readings from Last 3 Encounters:  07/05/20 '5\' 3"'$  (1.6 m)  11/03/19 '5\' 4"'$  (1.626 m)  10/18/18 5' 3.5" (1.613 m)    General appearance: alert, cooperative and appears stated age Head: Normocephalic, without obvious abnormality, atraumatic Neck: no adenopathy, supple, symmetrical, trachea midline and thyroid {CHL AMB PHY EX THYROID NORM DEFAULT:[phone]} Lungs: clear to auscultation bilaterally Cardiovascular: regular rate and rhythm Breasts: {Exam; breast:13139} Abdomen: soft, non-tender; non distended,  no masses,  no organomegaly Extremities: extremities normal, atraumatic, no cyanosis or edema Skin: Skin color,  texture, turgor normal. No rashes or lesions Lymph nodes: Cervical, supraclavicular, and axillary nodes normal. No abnormal inguinal nodes palpated Neurologic: Grossly normal   Pelvic: External genitalia:  no lesions              Urethra:  normal appearing urethra with no masses, tenderness or lesions              Bartholins and Skenes: normal                 Vagina: normal appearing vagina with normal color and discharge, no lesions              Cervix: {CHL AMB PHY EX CERVIX NORM DEFAULT:[phone]}               Bimanual Exam:  Uterus:  {CHL AMB PHY EX UTERUS NORM DEFAULT:[phone]}              Adnexa: {CHL AMB PHY EX ADNEXA NO MASS DEFAULT:[phone]}               Rectovaginal: Confirms               Anus:  normal sphincter  tone, no lesions  *** chaperoned for the exam.  A:  Well Woman with normal exam  P:

## 2020-11-22 ENCOUNTER — Ambulatory Visit: Payer: No Typology Code available for payment source | Admitting: Obstetrics and Gynecology

## 2020-11-25 ENCOUNTER — Ambulatory Visit (INDEPENDENT_AMBULATORY_CARE_PROVIDER_SITE_OTHER): Payer: No Typology Code available for payment source | Admitting: Obstetrics and Gynecology

## 2020-11-25 ENCOUNTER — Encounter: Payer: Self-pay | Admitting: Obstetrics and Gynecology

## 2020-11-25 ENCOUNTER — Other Ambulatory Visit: Payer: Self-pay

## 2020-11-25 VITALS — BP 132/68 | HR 95 | Ht 64.0 in | Wt 185.0 lb

## 2020-11-25 DIAGNOSIS — R7989 Other specified abnormal findings of blood chemistry: Secondary | ICD-10-CM

## 2020-11-25 DIAGNOSIS — Z01419 Encounter for gynecological examination (general) (routine) without abnormal findings: Secondary | ICD-10-CM | POA: Diagnosis not present

## 2020-11-25 DIAGNOSIS — Z862 Personal history of diseases of the blood and blood-forming organs and certain disorders involving the immune mechanism: Secondary | ICD-10-CM

## 2020-11-25 DIAGNOSIS — E039 Hypothyroidism, unspecified: Secondary | ICD-10-CM

## 2020-11-25 MED ORDER — LEVOTHYROXINE SODIUM 75 MCG PO TABS: 75.0000 ug | ORAL_TABLET | Freq: Every day | ORAL | 3 refills | Status: DC

## 2020-11-25 NOTE — Patient Instructions (Signed)

## 2020-11-25 NOTE — Progress Notes (Signed)
55 y.o. G1P0001 Married Black or [place] American Not Hispanic or Latino female here for annual exam.  PMP, no cycle for over a year.  No vasomotor symptoms, sometimes warm. No dyspareunia.  No bladder c/o.  She started on BP medication in the last year.    Recent blood work with her primary (through work). Mildly anemic, she isn't sure what the f/u is.  Her vit d was low. She is taking 2,000 IU a day. Also on a multivitamin. No melena or hematochezia.     Sexually active: Yes.    The current method of family planning is post menopausal status.    Exercising: No.  The patient does not participate in regular exercise at present. Smoker:  no  Health Maintenance: Pap:  10/18/18 Neg Hr HPV Neg,   08-27-15 neg HPV HR neg History of abnormal Pap:  yes Cryo surgery over 30 years ago  MMG:  01/29/20 Bi-rads 2 benign  BMD:   none  Colonoscopy: 09/26/14 f/u 10 years TDaP:  2013 Gardasil: n/a   reports that she has never smoked. She has never used smokeless tobacco. She reports current alcohol use. She reports that she does not use drugs. Rare ETOH. She works in processing orders, lots of walking. Daughter lives locally, 3 grandchildren (14,10, and 5).   Past Medical History:  Diagnosis Date   Abnormal Pap smear of cervix    30 yrs ago   Anemia    Bacterial vaginosis    Hematuria    Infertility, female    Thyroid nodule    LEFT 7/09    Past Surgical History:  Procedure Laterality Date   CRYOTHERAPY     for abnormal pap smear 21yr ago   [place]  abnormal pap - normal since   THYROID LOBECTOMY Right 2/95    Current Outpatient Medications  Medication Sig Dispense Refill   hydrochlorothiazide (HYDRODIURIL) 12.5 MG tablet Take 1 tablet (12.5 mg total) by mouth daily. 90 tablet 0   ibuprofen (ADVIL) 800 MG tablet Take 1 tablet (800 mg total) by mouth every 8 (eight) hours as needed for moderate pain. 30 tablet 0   levothyroxine (SYNTHROID) 75 MCG tablet Take 1  tablet (75 mcg total) by mouth daily before breakfast. 90 tablet 3   loratadine (CLARITIN) 10 MG tablet Take 1 tablet (10 mg total) by mouth daily. 30 tablet 11   Multiple Vitamin (MULTI-VITAMIN PO) Take by mouth daily.     Probiotic Product (PROBIOTIC PO) Take by mouth.     potassium chloride SA (KLOR-CON) 20 MEQ tablet Take 1 tablet (20 mEq total) by mouth daily. 60 tablet 0   No current facility-administered medications for this visit.    Family History  Problem Relation Age of Onset   Ovarian cancer Mother 665      ovarian cancer   Diabetes Mother    Prostate cancer Father 662      prostate cancer   Liver cancer Brother 5102      liver cancer   Kidney disease Sister        both kidneys removed   Prostate cancer Brother     Review of Systems  All other systems reviewed and are negative.  Exam:   BP 132/68   Pulse 95   Ht '5\' 4"'$  (1.626 m)   Wt 185 lb (83.9 kg)   LMP 09/21/2017   SpO2 99%   BMI 31.76 kg/m   Weight change: '@WEIGHTCHANGE'$ @  Height:   Height: '5\' 4"'$  (162.6 cm)  Ht Readings from Last 3 Encounters:  11/25/20 '5\' 4"'$  (1.626 m)  07/05/20 '5\' 3"'$  (1.6 m)  11/03/19 '5\' 4"'$  (1.626 m)    General appearance: alert, cooperative and appears stated age Head: Normocephalic, without obvious abnormality, atraumatic Neck: no adenopathy, supple, symmetrical, trachea midline and thyroid normal to inspection and palpation Lungs: clear to auscultation bilaterally Cardiovascular: regular rate and rhythm Breasts: normal appearance, no masses or tenderness Abdomen: soft, non-tender; non distended,  no masses,  no organomegaly Extremities: extremities normal, atraumatic, no cyanosis or edema Skin: Skin color, texture, turgor normal. No rashes or lesions Lymph nodes: Cervical, supraclavicular, and axillary nodes normal. No abnormal inguinal nodes palpated Neurologic: Grossly normal   Pelvic: External genitalia:  no lesions              Urethra:  normal appearing urethra with no  masses, tenderness or lesions              Bartholins and Skenes: normal                 Vagina: normal appearing vagina with normal color and discharge, no lesions              Cervix: no lesions               Bimanual Exam:  Uterus:   no masses or tenderness              Adnexa: no mass, fullness, tenderness               Rectovaginal: Confirms               Anus:  normal sphincter tone, no lesions  Gae Dry chaperoned for the exam.  1. Well woman exam Discussed breast self exam Discussed calcium and vit D intake Mammogram in 10/22 Colonoscopy is UTD No pap this year  2. Hypothyroidism, unspecified type Recent normal TSH - levothyroxine (SYNTHROID) 75 MCG tablet; Take 1 tablet (75 mcg total) by mouth daily before breakfast.  Dispense: 90 tablet; Refill: 3  3. Low vitamin D level Taking 2,000 IU a day  - VITAMIN D 25 Hydroxy (Vit-D Deficiency, Fractures)  4. History of anemia If still amenic, need to consider referral to hematology and or GI.  - CBC - Ferritin - Fecal Globin By Immunochemistry-([facility])

## 2020-11-26 LAB — CBC
HCT: 33.4 % — ABNORMAL LOW (ref 35.0–45.0)
Hemoglobin: 11.1 g/dL — ABNORMAL LOW (ref 11.7–15.5)
MCH: 30.3 pg (ref 27.0–33.0)
MCHC: 33.2 g/dL (ref 32.0–36.0)
MCV: 91.3 fL (ref 80.0–100.0)
MPV: 9.5 fL (ref 7.5–12.5)
Platelets: 355 10*3/uL (ref 140–400)
RBC: 3.66 10*6/uL — ABNORMAL LOW (ref 3.80–5.10)
RDW: 12.5 % (ref 11.0–15.0)
WBC: 8.6 10*3/uL (ref 3.8–10.8)

## 2020-11-26 LAB — VITAMIN D 25 HYDROXY (VIT D DEFICIENCY, FRACTURES): Vit D, 25-Hydroxy: 39 ng/mL (ref 30–100)

## 2020-11-26 LAB — FERRITIN: Ferritin: 98 ng/mL (ref 16–232)

## 2020-11-27 ENCOUNTER — Other Ambulatory Visit: Payer: Self-pay | Admitting: Obstetrics and Gynecology

## 2020-11-28 ENCOUNTER — Telehealth: Payer: Self-pay | Admitting: Registered Nurse

## 2020-11-28 ENCOUNTER — Encounter: Payer: Self-pay | Admitting: Registered Nurse

## 2020-11-28 ENCOUNTER — Telehealth: Payer: Self-pay

## 2020-11-28 DIAGNOSIS — D508 Other iron deficiency anemias: Secondary | ICD-10-CM

## 2020-11-28 DIAGNOSIS — D649 Anemia, unspecified: Secondary | ICD-10-CM

## 2020-11-28 NOTE — Telephone Encounter (Signed)
Per Dr. Talbert Nan "She is still mildly anemic, but has normal iron stores. I would recommend f/u with either her primary or with hematology for further evaluation."  Patient would like hematology referral as she does not currently have PCP.  Thanks

## 2020-11-28 NOTE — Telephone Encounter (Signed)
Patient seen at GYN today instructed to follow up with [facility] or hematology for anemia with normal iron level.  Reviewed patient paper chart at [facility] and labs dating back to 2007 showing mildly low RBCs/hgb/hct chronic stable.  Labs performed by GYN slightly improved RBCs this week compared to Mar 2022.  Vitamin D now normal on 2000 international units.  Patient has to establish with new PCM office also.  Patient reported her older sister stated she had same thing.  Currently 55 years old and anemia now resolved.  Patient hasn't had menses in 1 year.  Last colonoscopy 2016 and due in 10 years completely normal exam.  Patient denied tarry/bloody stools, blood on wiping.  Had blood in urine for a while/cystoscopy and scraping of bladder performed and all tests normal. Denied sickle cell disease.  Unsure of testing history for sickle cell "I think I was tested before marriage" Discussed with patient I agreed with GYN that hematology work up indicated for unexplained anemia.  Good news is chronic not worsening at this time.  Patient verbalized understanding information/instructions, agreed with plan of care and had no further questions at this time.  Results for Dawn, Trevino (MRN [id number]) as of 11/28/2020 13:51  Ref. Range 12/16/2017 16:10 07/18/2018 10:14 11/03/2019 15:33 07/05/2020 09:38 11/25/2020 15:40  Vitamin D, 25-Hydroxy Latest Ref Range: 30 - 100 ng/mL 34.7 15.0 (L) 22.2 (L) 24.4 (L) 39  Results for Dawn, Trevino (MRN [id number]) as of 11/28/2020 13:51  Ref. Range 01/20/2016 00:00 07/18/2018 10:14 07/05/2020 09:38 11/25/2020 15:40  Iron Latest Ref Range: 27 - 159 ug/dL 68 68 67   Ferritin Latest Ref Range: 16 - 232 ng/mL    98  Results for Dawn, REITSMA STOKES "KIM" (MRN [id number]) as of 11/28/2020 13:51  Ref. Range 09/07/2017 16:15 07/18/2018 10:14 11/03/2019 15:33 07/05/2020 09:38 11/25/2020 15:40  WBC Latest Ref Range: 3.8 - 10.8 Thousand/uL 7.9 6.3 7.8 6.3 8.6  RBC Latest Ref  Range: 3.80 - 5.10 Million/uL 3.41 (L) 3.53 (L) 3.77 3.35 (L) 3.66 (L)  Hemoglobin Latest Ref Range: 11.7 - 15.5 g/dL 11.0 (L) 11.1 11.6 11.0 (L) 11.1 (L)  HCT Latest Ref Range: 35.0 - 45.0 % 31.2 (L) 32.1 (L) 35.1 31.2 (L) 33.4 (L)  MCV Latest Ref Range: 80.0 - 100.0 fL 92 91 93 93 91.3  MCH Latest Ref Range: 27.0 - 33.0 pg 32.3 31.4 30.8 32.8 30.3  MCHC Latest Ref Range: 32.0 - 36.0 g/dL 35.3 34.6 33.0 35.3 33.2  RDW Latest Ref Range: 11.0 - 15.0 % 13.5 12.1 12.3 12.5 12.5  Platelets Latest Ref Range: 140 - 400 Thousand/uL 338 293 321 341 355  MPV Latest Ref Range: 7.5 - 12.5 fL     9.5  Neutrophils Latest Ref Range: Not Estab. %  59  57   Immature Granulocytes Latest Ref Range: Not Estab. %  0  0   NEUT# Latest Ref Range: 1.4 - 7.0 x10E3/uL  3.7  3.6   Lymphocyte # Latest Ref Range: 0.7 - 3.1 x10E3/uL  2.1  2.2   Monocytes Absolute Latest Ref Range: 0.1 - 0.9 x10E3/uL  0.5  0.4   Basophils Absolute Latest Ref Range: 0.0 - 0.2 x10E3/uL  0.0  0.0   Immature Grans (Abs) Latest Ref Range: 0.0 - 0.1 x10E3/uL  0.0  0.0   Lymphs Latest Ref Range: Not Estab. %  33  35   Monocytes Latest Ref Range: Not Estab. %  7  7  Basos Latest Ref Range: Not Estab. %  0  0   Eos Latest Ref Range: Not Estab. %  1  1   EOS (ABSOLUTE) Latest Ref Range: 0.0 - 0.4 x10E3/uL  0.1  0.1   Hematology Comments: Unknown   Note:

## 2020-12-02 NOTE — Telephone Encounter (Signed)
Patient informed referral placed at [facility] they will call to schedule.

## 2020-12-03 ENCOUNTER — Telehealth: Payer: Self-pay | Admitting: Physician Assistant

## 2020-12-03 NOTE — Telephone Encounter (Signed)
Received anew hem referral from Dr. Talbert Nan for IDA. MS. Marcel has been cld and scheduled to see Murray Hodgkins on 8/23 at 9am. Pt aware to arrive 15 minutes early.

## 2020-12-05 LAB — FECAL GLOBIN BY IMMUNOCHEMISTRY
FECAL GLOBIN RESULT:: NOT DETECTED
MICRO NUMBER:: 12213362
SPECIMEN QUALITY:: ADEQUATE

## 2020-12-05 LAB — HOUSE ACCOUNT TRACKING

## 2020-12-05 NOTE — Telephone Encounter (Signed)
Patient scheduled on 12/17/20 with Lincoln Brigham, PA-C

## 2020-12-11 NOTE — Telephone Encounter (Addendum)
Noted in epic hematology/oncology appt 12/17/20 for IDA

## 2020-12-16 NOTE — Progress Notes (Signed)
[place] [phone]   [phone] [place] NOTE  Patient Care Team: Deland Pretty, MD as PCP - General (Internal Medicine)  Hematological/Oncological History 1) Labs from OB/GYN, Dr. Sumner Boast:  -07/05/2020: WBC 6.3. Hgb 11.0 (L), MCV 31.2, Plt 341 -11/25/2020: WBC 8.6, Hgb 11.1 9L(L), MCV 33.4, Plt 355, Ferritin 98  2) 12/17/2020: Establish care with Dede Query PA-C  CHIEF COMPLAINTS/PURPOSE OF CONSULTATION:  Normocytic Anemia  HISTORY OF PRESENTING ILLNESS:  Dawn Trevino 55 y.o. female with medical history significant for hypothyroidism and vitamin D deficiency.  Patient is unaccompanied for this visit,  On exam today, Dawn Trevino reports that her energy levels are quite stable.  She reports and tries to stay active.  She is able to complete all her daily activities she has a good appetite and denies any dietary restrictions.  Patient denies any nausea, vomiting or abdominal pain.  Her bowel movements are stable where she has a bowel movement 2 to 3 times a week.  She denies any bleeding or constipation.  She denies easy bruising or signs of bleeding.  This includes hematochezia, melena, epistaxis, hemoptysis, hematuria or gingival bleeding.  She underwent menopause menstrual cycle since 2000. She denies any fevers, chills, shortness of breath, chest pain or cough.  She has no other complaints.  Rest of the 10 point ROS is below.    MEDICAL HISTORY:  Past Medical History:  Diagnosis Date   Abnormal Pap smear of cervix    30 yrs ago   Anemia    Bacterial vaginosis    Hematuria    Infertility, female    Thyroid nodule    LEFT 7/09    SURGICAL HISTORY: Past Surgical History:  Procedure Laterality Date   CRYOTHERAPY     for abnormal pap smear 53yr ago   [place]  abnormal pap - normal since   THYROID LOBECTOMY Right 2/95    SOCIAL HISTORY: Social History   Socioeconomic History    Marital status: Married    Spouse name: Not on file   Number of children: 1   Years of education: Not on file   Highest education level: Not on file  Occupational History    Employer: [employer]  Tobacco Use   Smoking status: Never   Smokeless tobacco: Never  Substance and Sexual Activity   Alcohol use: Yes    Comment: rare   Drug use: No   Sexual activity: Yes    Partners: Male    Birth control/protection: None  Other Topics Concern   Not on file  Social History Narrative   Not on file   Social Determinants of Health   Financial Resource Strain: Not on file  Food Insecurity: Not on file  Transportation Needs: Not on file  Physical Activity: Not on file  Stress: Not on file  Social Connections: Not on file  Intimate Partner Violence: Not on file    FAMILY HISTORY: Family History  Problem Relation Age of Onset   Ovarian cancer Mother 610      ovarian cancer   Diabetes Mother    Prostate cancer Father 642      prostate cancer   Liver cancer Brother 535      liver cancer   Kidney disease Sister        both kidneys removed   Prostate cancer Brother     ALLERGIES:  has No Known Allergies.  MEDICATIONS:  Current  Outpatient Medications  Medication Sig Dispense Refill   hydrochlorothiazide (HYDRODIURIL) 12.5 MG tablet Take 1 tablet (12.5 mg total) by mouth daily. 90 tablet 0   ibuprofen (ADVIL) 800 MG tablet Take 1 tablet (800 mg total) by mouth every 8 (eight) hours as needed for moderate pain. 30 tablet 0   levothyroxine (SYNTHROID) 75 MCG tablet Take 1 tablet (75 mcg total) by mouth daily before breakfast. 90 tablet 3   Multiple Vitamin (MULTI-VITAMIN PO) Take by mouth daily.     potassium chloride SA (KLOR-CON) 20 MEQ tablet Take 1 tablet (20 mEq total) by mouth daily. 60 tablet 0   Probiotic Product (PROBIOTIC PO) Take by mouth.     loratadine (CLARITIN) 10 MG tablet Take 1 tablet (10 mg total) by mouth daily. (Patient not taking: Reported on 12/17/2020)  30 tablet 11   No current facility-administered medications for this visit.    REVIEW OF SYSTEMS:   Constitutional: ( - ) fevers, ( - )  chills , ( - ) night sweats Eyes: ( - ) blurriness of vision, ( - ) double vision, ( - ) watery eyes Ears, nose, mouth, throat, and face: ( - ) mucositis, ( - ) sore throat Respiratory: ( - ) cough, ( - ) dyspnea, ( - ) wheezes Cardiovascular: ( - ) palpitation, ( - ) chest discomfort, ( - ) lower extremity swelling Gastrointestinal:  ( - ) nausea, ( - ) heartburn, ( - ) change in bowel habits Skin: ( - ) abnormal skin rashes Lymphatics: ( - ) new lymphadenopathy, ( - ) easy bruising Neurological: ( - ) numbness, ( - ) tingling, ( - ) new weaknesses Behavioral/Psych: ( - ) mood change, ( - ) new changes  All other systems were reviewed with the patient and are negative.  PHYSICAL EXAMINATION: ECOG PERFORMANCE STATUS: 0 - Asymptomatic  Vitals:   12/17/20 0928  BP: 125/73  Pulse: 77  Resp: 18  Temp: 98.6 F (37 C)  SpO2: 100%   Filed Weights   12/17/20 0928  Weight: 184 lb 4.8 oz (83.6 kg)    GENERAL: well appearing female in NAD  SKIN: skin color, texture, turgor are normal, no rashes or significant lesions EYES: conjunctiva are pink and non-injected, sclera clear OROPHARYNX: no exudate, no erythema; lips, buccal mucosa, and tongue normal  LYMPH:  no palpable lymphadenopathy in the cervical, axillary or supraclavicular lymph nodes.  LUNGS: clear to auscultation and percussion with normal breathing effort HEART: regular rate & rhythm and no murmurs and no lower extremity edema ABDOMEN: soft, non-tender, non-distended, normal bowel sounds Musculoskeletal: no cyanosis of digits and no clubbing  PSYCH: alert & oriented x 3, fluent speech NEURO: no focal motor/sensory deficits  LABORATORY DATA:  I have reviewed the data as listed CBC Latest Ref Rng & Units 11/25/2020 07/05/2020 11/03/2019  WBC 3.8 - 10.8 Thousand/uL 8.6 6.3 7.8  Hemoglobin  11.7 - 15.5 g/dL 11.1(L) 11.0(L) 11.6  Hematocrit 35.0 - 45.0 % 33.4(L) 31.2(L) 35.1  Platelets 140 - 400 Thousand/uL 355 341 321    CMP Latest Ref Rng & Units 07/05/2020 04/02/2020 11/03/2019  Glucose 65 - 99 mg/dL 89 81 84  BUN 6 - 24 mg/dL '11 12 12  '$ Creatinine 0.57 - 1.00 mg/dL 0.70 0.73 0.81  Sodium 134 - 144 mmol/L 140 143 140  Potassium 3.5 - 5.2 mmol/L 3.8 3.4(L) 3.7  Chloride 96 - 106 mmol/L 97 101 102  CO2 20 - 29 mmol/L - 27 25  Calcium 8.7 - 10.2 mg/dL 9.5 9.0 9.1  Total Protein 6.0 - 8.5 g/dL 7.5 - 7.0  Total Bilirubin 0.0 - 1.2 mg/dL 0.4 - 0.4  Alkaline Phos 44 - 121 IU/L 78 - 80  AST 0 - 40 IU/L 17 - 16  ALT 0 - 32 IU/L 11 - 14    ASSESSMENT & PLAN Dawn Trevino is a 55 y.o. female who presents to the clinic for evaluation for normocytic anemia.  I reviewed most recent labs from 11/25/2020 showed mild anemia with a hemoglobin of 11.9.  Patient has been found to have chronic anemia for several years.  Ferritin level was checked on 11/25/2020 without normal limits.  Recommend proceeding with a full work-up to evaluate for underlying cause.  This includes a complete iron panel to check ferritin, iron and TIBC and retic panel. Additionally, we will evaluate for nutritional deficiencies by checking folate, vitamin B12 and methylmalonic acid levels.  #Normocytic Anemia: -Uncertain underlying cause but chronic in nature -Labs from 11/25/2020 show mild anemia with Hgb 11.1, MCV 91.3. Ferritin 98 -Proceed with labs today to check CBC, CMP, iron and TIBC, ferritin, retic panel, vitamin B12, folate and MMA levels.  -Return visit will be based on above workup.   Orders Placed This Encounter  Procedures   CBC with Differential ([place] Only)    Standing Status:   Future    Standing Expiration Date:   12/16/2021   CMP ([place] only)    Standing Status:   Future    Standing Expiration Date:   12/16/2021   Ferritin    Standing Status:   Future    Standing Expiration  Date:   12/16/2021   Iron and TIBC    Standing Status:   Future    Standing Expiration Date:   12/16/2021   Retic Panel    Standing Status:   Future    Standing Expiration Date:   12/16/2021   Vitamin B12    Standing Status:   Future    Standing Expiration Date:   12/16/2021   Folate, Serum    Standing Status:   Future    Standing Expiration Date:   12/16/2021   Methylmalonic acid, serum    Standing Status:   Future    Standing Expiration Date:   12/16/2021    All questions were answered. The patient knows to call the clinic with any problems, questions or concerns.  I have spent a total of 50 minutes minutes of face-to-face and non-face-to-face time, preparing to see the patient,  reviewing separately obtained history, performing a medically appropriate examination, counseling and educating the patient, ordering tests, documenting clinical information in the electronic health record, and care coordination.   Dede Query, PA-C Department of Hematology/Oncology [place] at [facility] Phone: [phone]

## 2020-12-17 ENCOUNTER — Other Ambulatory Visit: Payer: Self-pay

## 2020-12-17 ENCOUNTER — Inpatient Hospital Stay: Payer: No Typology Code available for payment source | Attending: Physician Assistant | Admitting: Physician Assistant

## 2020-12-17 ENCOUNTER — Other Ambulatory Visit: Payer: Self-pay | Admitting: Obstetrics and Gynecology

## 2020-12-17 ENCOUNTER — Inpatient Hospital Stay: Payer: No Typology Code available for payment source

## 2020-12-17 ENCOUNTER — Encounter: Payer: Self-pay | Admitting: Physician Assistant

## 2020-12-17 VITALS — BP 125/73 | HR 77 | Temp 98.6°F | Resp 18 | Ht 64.0 in | Wt 184.3 lb

## 2020-12-17 DIAGNOSIS — Z8041 Family history of malignant neoplasm of ovary: Secondary | ICD-10-CM | POA: Insufficient documentation

## 2020-12-17 DIAGNOSIS — D649 Anemia, unspecified: Secondary | ICD-10-CM | POA: Insufficient documentation

## 2020-12-17 DIAGNOSIS — E039 Hypothyroidism, unspecified: Secondary | ICD-10-CM

## 2020-12-17 DIAGNOSIS — Z809 Family history of malignant neoplasm, unspecified: Secondary | ICD-10-CM | POA: Insufficient documentation

## 2020-12-17 LAB — CBC WITH DIFFERENTIAL (CANCER CENTER ONLY)
Abs Immature Granulocytes: 0.01 10*3/uL (ref 0.00–0.07)
Basophils Absolute: 0 10*3/uL (ref 0.0–0.1)
Basophils Relative: 0 %
Eosinophils Absolute: 0.1 10*3/uL (ref 0.0–0.5)
Eosinophils Relative: 1 %
HCT: 34 % — ABNORMAL LOW (ref 36.0–46.0)
Hemoglobin: 11.2 g/dL — ABNORMAL LOW (ref 12.0–15.0)
Immature Granulocytes: 0 %
Lymphocytes Relative: 30 %
Lymphs Abs: 2 10*3/uL (ref 0.7–4.0)
MCH: 29.9 pg (ref 26.0–34.0)
MCHC: 32.9 g/dL (ref 30.0–36.0)
MCV: 90.9 fL (ref 80.0–100.0)
Monocytes Absolute: 0.4 10*3/uL (ref 0.1–1.0)
Monocytes Relative: 6 %
Neutro Abs: 4.3 10*3/uL (ref 1.7–7.7)
Neutrophils Relative %: 63 %
Platelet Count: 340 10*3/uL (ref 150–400)
RBC: 3.74 MIL/uL — ABNORMAL LOW (ref 3.87–5.11)
RDW: 12.7 % (ref 11.5–15.5)
WBC Count: 6.8 10*3/uL (ref 4.0–10.5)
nRBC: 0 % (ref 0.0–0.2)

## 2020-12-17 LAB — CMP (CANCER CENTER ONLY)
ALT: 13 U/L (ref 0–44)
AST: 15 U/L (ref 15–41)
Albumin: 3.7 g/dL (ref 3.5–5.0)
Alkaline Phosphatase: 73 U/L (ref 38–126)
Anion gap: 10 (ref 5–15)
BUN: 13 mg/dL (ref 6–20)
CO2: 29 mmol/L (ref 22–32)
Calcium: 9.4 mg/dL (ref 8.9–10.3)
Chloride: 104 mmol/L (ref 98–111)
Creatinine: 0.82 mg/dL (ref 0.44–1.00)
GFR, Estimated: >60 mL/min (ref >60)
Glucose, Bld: 99 mg/dL (ref 70–99)
Potassium: 3.6 mmol/L (ref 3.5–5.1)
Sodium: 143 mmol/L (ref 135–145)
Total Bilirubin: 0.7 mg/dL (ref 0.3–1.2)
Total Protein: 7.8 g/dL (ref 6.5–8.1)

## 2020-12-17 LAB — FERRITIN: Ferritin: 124 ng/mL (ref 11–307)

## 2020-12-17 LAB — IRON AND TIBC
Iron: 74 ug/dL (ref 41–142)
Saturation Ratios: 23 % (ref 21–57)
TIBC: 326 ug/dL (ref 236–444)
UIBC: 252 ug/dL (ref 120–384)

## 2020-12-17 LAB — RETIC PANEL
Immature Retic Fract: 10.5 % (ref 2.3–15.9)
RBC.: 3.68 MIL/uL — ABNORMAL LOW (ref 3.87–5.11)
Retic Count, Absolute: 65.1 10*3/uL (ref 19.0–186.0)
Retic Ct Pct: 1.8 % (ref 0.4–3.1)
Reticulocyte Hemoglobin: 33.3 pg (ref >27.9)

## 2020-12-17 LAB — FOLATE: Folate: 33.4 ng/mL (ref >5.9)

## 2020-12-17 LAB — VITAMIN B12: Vitamin B-12: 356 pg/mL (ref 180–914)

## 2020-12-19 LAB — METHYLMALONIC ACID, SERUM: Methylmalonic Acid, Quantitative: 126 nmol/L (ref 0–378)

## 2020-12-20 ENCOUNTER — Telehealth: Payer: Self-pay | Admitting: Physician Assistant

## 2020-12-20 ENCOUNTER — Other Ambulatory Visit: Payer: Self-pay | Admitting: Obstetrics and Gynecology

## 2020-12-20 DIAGNOSIS — Z1231 Encounter for screening mammogram for malignant neoplasm of breast: Secondary | ICD-10-CM

## 2020-12-20 DIAGNOSIS — D649 Anemia, unspecified: Secondary | ICD-10-CM

## 2020-12-20 NOTE — Telephone Encounter (Signed)
Scheduled lab per provider request. Called and left msg.

## 2020-12-20 NOTE — Telephone Encounter (Signed)
I called and spoke to Ms. Dawn Trevino to review the lab results from 12/17/2020. Her anemia continues to be mild with a hemoglobin of 11.2. There was no evidence of iron, B12 or folate deficiency. I recommend patient return for additional labs to check for copper deficiency, hemolysis, and paraproteinemia. If additional workup is negative, we can have the patient return in 6 months to monitor since her anemia is mild and stable for several years. Patient expressed understanding and requested for labs to be schedule later in the day around 3:15-3:30pm so she doesn't have to miss work. I will call her with the results of the labs.

## 2020-12-21 NOTE — Telephone Encounter (Signed)
Epic reviewed PA Thayil requested further labs and scheduled 12/26/20.  "I called and spoke to Ms. Zakhia Burtnett to review the lab results from 12/17/2020. Her anemia continues to be mild with a hemoglobin of 11.2. There was no evidence of iron, B12 or folate deficiency. I recommend patient return for additional labs to check for copper deficiency, hemolysis, and paraproteinemia. If additional workup is negative, we can have the patient return in 6 months to monitor since her anemia is mild and stable for several years. Patient expressed understanding and requested for labs to be schedule later in the day around 3:15-3:30pm so she doesn't have to miss work. I will call her with the results of the labs. "

## 2020-12-26 ENCOUNTER — Inpatient Hospital Stay: Payer: No Typology Code available for payment source | Attending: Physician Assistant

## 2020-12-26 ENCOUNTER — Other Ambulatory Visit: Payer: Self-pay

## 2020-12-26 DIAGNOSIS — D649 Anemia, unspecified: Secondary | ICD-10-CM | POA: Insufficient documentation

## 2020-12-26 LAB — LACTATE DEHYDROGENASE: LDH: 141 U/L (ref 98–192)

## 2020-12-27 LAB — COPPER, SERUM: Copper: 126 ug/dL (ref 80–158)

## 2020-12-27 LAB — HAPTOGLOBIN: Haptoglobin: 232 mg/dL (ref 33–346)

## 2020-12-31 LAB — MULTIPLE MYELOMA PANEL, SERUM
Albumin SerPl Elph-Mcnc: 3.5 g/dL (ref 2.9–4.4)
Albumin/Glob SerPl: 1.1 (ref 0.7–1.7)
Alpha 1: 0.2 g/dL (ref 0.0–0.4)
Alpha2 Glob SerPl Elph-Mcnc: 0.8 g/dL (ref 0.4–1.0)
B-Globulin SerPl Elph-Mcnc: 1.1 g/dL (ref 0.7–1.3)
Gamma Glob SerPl Elph-Mcnc: 1.4 g/dL (ref 0.4–1.8)
Globulin, Total: 3.5 g/dL (ref 2.2–3.9)
IgA: 324 mg/dL (ref 87–352)
IgG (Immunoglobin G), Serum: 1256 mg/dL (ref 586–1602)
IgM (Immunoglobulin M), Srm: 117 mg/dL (ref 26–217)
Total Protein ELP: 7 g/dL (ref 6.0–8.5)

## 2021-01-01 ENCOUNTER — Telehealth: Payer: Self-pay | Admitting: Physician Assistant

## 2021-01-01 NOTE — Telephone Encounter (Signed)
I called Sramek to review the additional lab results from 12/27/2015.  There is no evidence of hemolysis, copper deficiency or paraproteinemia.  I explained that the next step for evaluation would include a bone marrow biopsy but since patient's anemia is very mild, recommendation is to monitor.  Patient inquired about restarting iron supplements as she did in the past.  I explained that her iron panel did not show deficiency.  She would like to incorporate more iron rich foods into her diet. I will send a message through myChart with a list of iron rich foods that she can try to incorporate into her diet. I will see the patient back in 6 months with repeat labs.   Patient expressed understanding and satisfaction with the plan provided.

## 2021-01-14 ENCOUNTER — Telehealth: Payer: Self-pay | Admitting: Registered Nurse

## 2021-01-14 DIAGNOSIS — M25569 Pain in unspecified knee: Secondary | ICD-10-CM

## 2021-01-14 MED ORDER — IBUPROFEN 800 MG PO TABS: 800.0000 mg | ORAL_TABLET | Freq: Three times a day (TID) | ORAL | 0 refills | Status: AC | PRN

## 2021-01-14 NOTE — Telephone Encounter (Signed)
Patient last filled ibuprofen 818m po TID prn pain #30 RF[facility]  Ran out and knees bothering her today would like refill.  [facility]  [facility] refilled today 30 day supply from [facility].  Patient also stated hematology told her second panel of labs normal/negative.  Bone marrow biopsy discussed but holding off at this time.  No iron supplement at this time.  Will repeat CBC in 6 months.  Results for HROSE, HEGNER(MRN [id number] as of 01/14/2021 12:45  Ref. Range 11/25/2020 15:40 11/27/2020 00:00 12/17/2020 10:04 12/26/2020 15:44  Sodium Latest Ref Range: 135 - 145 mmol/L   143   Potassium Latest Ref Range: 3.5 - 5.1 mmol/L   3.6   Chloride Latest Ref Range: 98 - 111 mmol/L   104   CO2 Latest Ref Range: 22 - 32 mmol/L   29   Glucose Latest Ref Range: 70 - 99 mg/dL   99   BUN Latest Ref Range: 6 - 20 mg/dL   13   Creatinine Latest Ref Range: 0.44 - 1.00 mg/dL   0.82   Calcium Latest Ref Range: 8.9 - 10.3 mg/dL   9.4   Anion gap Latest Ref Range: 5 - 15    10   Alkaline Phosphatase Latest Ref Range: 38 - 126 U/L   73   Albumin Latest Ref Range: 3.5 - 5.0 g/dL   3.7   AST Latest Ref Range: 15 - 41 U/L   15   ALT Latest Ref Range: 0 - 44 U/L   13   Total Protein Latest Ref Range: 6.5 - 8.1 g/dL   7.8   Total Bilirubin Latest Ref Range: 0.3 - 1.2 mg/dL   0.7   GFR, Est Non African American Latest Ref Range: >60 mL/min   >60   LDH Latest Ref Range: 98 - 192 U/L    141  Iron Latest Ref Range: 41 - 142 ug/dL   74   UIBC Latest Ref Range: 120 - 384 ug/dL   252   TIBC Latest Ref Range: 236 - 444 ug/dL   326   Saturation Ratios Latest Ref Range: 21 - 57 %   23   Ferritin Latest Ref Range: 11 - 307 ng/mL 98  124   Folate Latest Ref Range: >5.9 ng/mL   33.4   Copper Latest Ref Range: 80 - 158 ug/dL    126  Methylmalonic Acid, Quantitative Latest Ref Range: 0 - 378 nmol/L   126   Vitamin D, 25-Hydroxy Latest Ref Range: 30 - 100 ng/mL 39      Vitamin B12 Latest Ref Range: 180 - 914 pg/mL   356   Total Protein ELP Latest Ref Range: 6.0 - 8.5 g/dL    7.0  Albumin SerPl Elph-Mcnc Latest Ref Range: 2.9 - 4.4 g/dL    3.5  Albumin/Glob SerPl Latest Ref Range: 0.7 - 1.7     1.1  Alpha2 Glob SerPl Elph-Mcnc Latest Ref Range: 0.4 - 1.0 g/dL    0.8  Alpha 1 Latest Ref Range: 0.0 - 0.4 g/dL    0.2  Gamma Glob SerPl Elph-Mcnc Latest Ref Range: 0.4 - 1.8 g/dL    1.4  M Protein SerPl Elph-Mcnc Latest Ref Range: Not Observed g/dL    Not Observed  IFE 1 Unknown    Comment  Globulin, Total Latest Ref Range: 2.2 - 3.9 g/dL    3.5  B-Globulin SerPl Elph-Mcnc Latest  Ref Range: 0.7 - 1.3 g/dL    1.1  IgG (Immunoglobin G), Serum Latest Ref Range: 586 - 1,602 mg/dL    1,256  IgM (Immunoglobulin M), Srm Latest Ref Range: 26 - 217 mg/dL    117  IgA Latest Ref Range: 87 - 352 mg/dL    324  WBC Latest Ref Range: 4.0 - 10.5 K/uL 8.6  6.8   RBC Latest Ref Range: 3.87 - 5.11 MIL/uL 3.66 (L)  3.74 (L)   Hemoglobin Latest Ref Range: 12.0 - 15.0 g/dL 11.1 (L)  11.2 (L)   HCT Latest Ref Range: 36.0 - 46.0 % 33.4 (L)  34.0 (L)   MCV Latest Ref Range: 80.0 - 100.0 fL 91.3  90.9   MCH Latest Ref Range: 26.0 - 34.0 pg 30.3  29.9   MCHC Latest Ref Range: 30.0 - 36.0 g/dL 33.2  32.9   RDW Latest Ref Range: 11.5 - 15.5 % 12.5  12.7   Platelets Latest Ref Range: 150 - 400 K/uL 355  340   MPV Latest Ref Range: 7.5 - 12.5 fL 9.5     nRBC Latest Ref Range: 0.0 - 0.2 %   0.0   Neutrophils Latest Units: %   63   Lymphocytes Latest Units: %   30   Monocytes Relative Latest Units: %   6   Eosinophil Latest Units: %   1   Basophil Latest Units: %   0   Immature Granulocytes Latest Units: %   0   NEUT# Latest Ref Range: 1.7 - 7.7 K/uL   4.3   Lymphocyte # Latest Ref Range: 0.7 - 4.0 K/uL   2.0   Monocyte # Latest Ref Range: 0.1 - 1.0 K/uL   0.4   Eosinophils Absolute Latest Ref Range: 0.0 - 0.5 K/uL   0.1   Basophils Absolute Latest Ref Range: 0.0 - 0.1 K/uL   0.0    Abs Immature Granulocytes Latest Ref Range: 0.00 - 0.07 K/uL   0.01   RBC. Latest Ref Range: 3.87 - 5.11 MIL/uL   3.68 (L)   Retic Ct Pct Latest Ref Range: 0.4 - 3.1 %   1.8   Retic Count, Absolute Latest Ref Range: 19.0 - 186.0 K/uL   65.1   Reticulocyte Hemoglobin Latest Ref Range: >27.9 pg   33.3   Immature Retic Fract Latest Ref Range: 2.3 - 15.9 %   10.5   Please Note (HCV): Unknown    Comment  Source Unknown  STOOL    STATUS: Unknown  FINAL    FECAL GLOBIN RESULT: Unknown  Not Detected    Tracking House Account Unknown  [place]    Reviewed Epic hematology notes Lagretta Dawn Trevino is a 55 y.o. female who presents to the clinic for evaluation for normocytic anemia.  I reviewed most recent labs from 11/25/2020 showed mild anemia with a hemoglobin of 11.9.  Patient has been found to have chronic anemia for several years.  Ferritin level was checked on 11/25/2020 without normal limits.   Recommend proceeding with a full work-up to evaluate for underlying cause.  This includes a complete iron panel to check ferritin, iron and TIBC and retic panel. Additionally, we will evaluate for nutritional deficiencies by checking folate, vitamin B12 and methylmalonic acid levels.   #Normocytic Anemia: -Uncertain underlying cause but chronic in nature -Labs from 11/25/2020 show mild anemia with Hgb 11.1, MCV 91.3. Ferritin 98 -Proceed with labs today to check CBC, CMP, iron and TIBC, ferritin,  retic panel, vitamin B12, folate and MMA levels.  -Return visit will be based on above workup.   Per telephone encounter PA Thayil hematology There is no evidence of hemolysis, copper deficiency or paraproteinemia.  I explained that the next step for evaluation would include a bone marrow biopsy but since patient's anemia is very mild, recommendation is to monitor.  Patient inquired about restarting iron supplements as she did in the past.  I explained that her iron panel did not show deficiency.  She would like to  incorporate more iron rich foods into her diet. I will send a message through myChart with a list of iron rich foods that she can try to incorporate into her diet. I will see the patient back in 6 months with repeat labs.

## 2021-01-14 NOTE — Telephone Encounter (Signed)
See tcon dated 01/14/21 labs completed next hematology follow up in 6 months/repeat CBC

## 2021-01-30 ENCOUNTER — Other Ambulatory Visit: Payer: Self-pay

## 2021-01-30 ENCOUNTER — Ambulatory Visit
Admission: RE | Admit: 2021-01-30 | Discharge: 2021-01-30 | Disposition: A | Discharge status: Home / Self Care | Payer: No Typology Code available for payment source | Source: Ambulatory Visit | Attending: Obstetrics and Gynecology | Admitting: Obstetrics and Gynecology

## 2021-01-30 DIAGNOSIS — Z1231 Encounter for screening mammogram for malignant neoplasm of breast: Secondary | ICD-10-CM

## 2021-04-03 ENCOUNTER — Telehealth: Payer: Self-pay | Admitting: Registered Nurse

## 2021-04-03 DIAGNOSIS — T50905A Adverse effect of unspecified drugs, medicaments and biological substances, initial encounter: Secondary | ICD-10-CM

## 2021-04-03 DIAGNOSIS — E876 Hypokalemia: Secondary | ICD-10-CM

## 2021-04-03 DIAGNOSIS — I10 Essential (primary) hypertension: Secondary | ICD-10-CM

## 2021-04-03 DIAGNOSIS — M25569 Pain in unspecified knee: Secondary | ICD-10-CM

## 2021-04-03 MED ORDER — IBUPROFEN 800 MG PO TABS: 800.0000 mg | ORAL_TABLET | Freq: Three times a day (TID) | ORAL | 0 refills | Status: AC | PRN

## 2021-04-03 MED ORDER — POTASSIUM CHLORIDE CRYS ER 20 MEQ PO TBCR: 20.0000 meq | EXTENDED_RELEASE_TABLET | Freq: Every day | ORAL | 0 refills | Status: DC

## 2021-04-03 MED ORDER — HYDROCHLOROTHIAZIDE 12.5 MG PO TABS: 12.5000 mg | ORAL_TABLET | Freq: Every day | ORAL | 0 refills | Status: DC

## 2021-04-03 NOTE — Telephone Encounter (Signed)
Per [facility] patient last filled potassium chloride 10/01/2020; ibuprofen 800mg  po TID #30 on 01/14/21 and hydrochlorothiazide 12.5mg  po daily 12/17/20.  Patient stated prefers to eat potassium rich foods.  Last labs normal renal and electrolytes 12/17/20 and BP in goal range at last office appt hematology.  Next labs Mar 2023 [facility] Well fasting.  Latest Reference Range & Units 12/17/20 10:04  Sodium 135 - 145 mmol/L 143  Potassium 3.5 - 5.1 mmol/L 3.6  Chloride 98 - 111 mmol/L 104  CO2 22 - 32 mmol/L 29  Glucose 70 - 99 mg/dL 99  BUN 6 - 20 mg/dL 13  Creatinine 0.44 - 1.00 mg/dL 0.82  Calcium 8.9 - 10.3 mg/dL 9.4  Anion gap 5 - 15  10  Alkaline Phosphatase 38 - 126 U/L 73  Albumin 3.5 - 5.0 g/dL 3.7  AST 15 - 41 U/L 15  ALT 0 - 44 U/L 13  Total Protein 6.5 - 8.1 g/dL 7.8  Total Bilirubin 0.3 - 1.2 mg/dL 0.7  GFR, Est Non African American >60 mL/min >60  90 day supply hydrochlorothiazide 12.5mg  po daily dispensed from [facility] to patient today along with ibuprofen 800mg  po TID prn pain #30 RF0 to patient today.  Patient verbalized understanding information/instructions and no further questions at this time.

## 2021-04-15 ENCOUNTER — Ambulatory Visit: Payer: Self-pay | Admitting: Registered Nurse

## 2021-04-15 ENCOUNTER — Other Ambulatory Visit: Payer: Self-pay

## 2021-04-15 ENCOUNTER — Encounter: Payer: Self-pay | Admitting: Registered Nurse

## 2021-04-15 VITALS — BP 116/85 | HR 71 | Temp 96.8°F

## 2021-04-15 DIAGNOSIS — M722 Plantar fascial fibromatosis: Secondary | ICD-10-CM

## 2021-04-15 MED ORDER — PREDNISONE 10 MG PO TABS: ORAL_TABLET | ORAL | 0 refills | Status: AC

## 2021-04-15 NOTE — Progress Notes (Signed)
Subjective:    Patient ID: Dawn Trevino, female    DOB: [birth year], 55 y.o.   MRN: [id number]  55y/o African American established female pt c/o R foot pain x1 month. Hx plantar fasciitis bilaterally in the past.  Has orthotics from podiatry but hasn't been using in footwear.  Wearing house slippers not walking barefoot at home.  Would like refill of prednisone taper last used 10 years ago to get flare subsided.  Denied new trauma/bruising/injury.  Stretching/AROM before getting out of bed in the morning symptoms the worst and motrin not helping.  Limping in mornings.  Denied rash/bruising/swelling.     Review of Systems  Constitutional:  Negative for activity change, appetite change, chills, diaphoresis, fatigue and fever.  HENT:  Negative for trouble swallowing and voice change.   Eyes:  Negative for photophobia and visual disturbance.  Respiratory:  Negative for cough and shortness of breath.   Cardiovascular:  Positive for leg swelling. Negative for chest pain.  Gastrointestinal:  Negative for diarrhea, nausea and vomiting.  Endocrine: Negative for cold intolerance.  Genitourinary:  Negative for difficulty urinating.  Musculoskeletal:  Positive for gait problem and myalgias. Negative for back pain, joint swelling, neck pain and neck stiffness.  Skin:  Negative for color change, rash and wound.  Allergic/Immunologic: Positive for environmental allergies. Negative for food allergies.  Neurological:  Negative for dizziness, tremors, seizures, syncope, facial asymmetry, speech difficulty, weakness, light-headedness, numbness and headaches.  Hematological:  Negative for adenopathy. Does not bruise/bleed easily.  Psychiatric/Behavioral:  Negative for agitation, confusion and sleep disturbance.       Objective:   Physical Exam Vitals and nursing note reviewed.  Constitutional:      General: She is awake. She is not in acute distress.    Appearance: Normal appearance. She is  well-developed and well-groomed. She is obese. She is not ill-appearing, toxic-appearing or diaphoretic.  HENT:     Head: Normocephalic and atraumatic.     Jaw: There is normal jaw occlusion.     Salivary Glands: Right salivary gland is not diffusely enlarged. Left salivary gland is not diffusely enlarged.     Right Ear: Hearing and external ear normal.     Left Ear: Hearing and external ear normal.     Nose: Nose normal. No congestion or rhinorrhea.     Mouth/Throat:     Lips: Pink. No lesions.     Mouth: Mucous membranes are moist.     Pharynx: Oropharynx is clear.  Eyes:     General: Lids are normal. Vision grossly intact. Gaze aligned appropriately. No scleral icterus.       Right eye: No discharge.        Left eye: No discharge.     Extraocular Movements: Extraocular movements intact.     Conjunctiva/sclera: Conjunctivae normal.     Pupils: Pupils are equal, round, and reactive to light.  Neck:     Trachea: Trachea and phonation normal. No tracheal deviation.  Cardiovascular:     Rate and Rhythm: Normal rate and regular rhythm.     Pulses: Normal pulses.          Radial pulses are 2+ on the right side and 2+ on the left side.  Pulmonary:     Effort: Pulmonary effort is normal. No respiratory distress.     Breath sounds: Normal breath sounds and air entry. No stridor, decreased air movement or transmitted upper airway sounds. No decreased breath sounds or wheezing.  Comments: Spoke full sentences without difficulty; no cough observed in exam room Abdominal:     General: Abdomen is flat.     Palpations: Abdomen is soft.  Musculoskeletal:        General: Swelling and tenderness present. No deformity. Normal range of motion.     Cervical back: Normal range of motion and neck supple.     Right lower leg: Swelling present. No deformity, lacerations, tenderness or bony tenderness. 1+ Edema present.     Left lower leg: Swelling present. No deformity, lacerations, tenderness or  bony tenderness. 1+ Edema present.     Right foot: Normal range of motion and normal capillary refill. Tenderness present. No swelling, deformity, bunion, Charcot foot, foot drop, prominent metatarsal heads, laceration, bony tenderness or crepitus. Normal pulse.       Feet:     Comments: Mild pitting edema right lower leg 1+/4 nontender  Feet:     Right foot:     Skin integrity: Callus and dry skin present. No ulcer, blister, skin breakdown, erythema, warmth or fissure.     Toenail Condition: Right toenails are abnormally thick. Fungal disease present.    Comments: Distal toenails thickened, friable and discolored right foot ; callouses dry skin plantar surface Lymphadenopathy:     Head:     Right side of head: No submandibular or preauricular adenopathy.     Left side of head: No submandibular or preauricular adenopathy.     Cervical:     Right cervical: No superficial cervical adenopathy.    Left cervical: No superficial cervical adenopathy.  Skin:    General: Skin is warm and dry.     Capillary Refill: Capillary refill takes less than 2 seconds.     Coloration: Skin is not ashen, cyanotic, jaundiced, mottled, pale or sallow.     Findings: No abrasion, abscess, acne, bruising, burn, ecchymosis, erythema, signs of injury, laceration, lesion, petechiae, rash or wound.     Nails: There is no clubbing.  Neurological:     General: No focal deficit present.     Mental Status: She is alert and oriented to person, place, and time. Mental status is at baseline.     GCS: GCS eye subscore is 4. GCS verbal subscore is 5. GCS motor subscore is 6.     Cranial Nerves: Cranial nerves 2-12 are intact. No cranial nerve deficit, dysarthria or facial asymmetry.     Sensory: Sensation is intact. No sensory deficit.     Motor: Motor function is intact. No weakness, tremor, atrophy, abnormal muscle tone or seizure activity.     Coordination: Coordination is intact. Coordination normal.     Gait: Gait is  intact. Gait normal.     Comments: In/out of chair without difficulty; gait sure and steady in clinic; bilateral hand grasp equal 5/5; normal heel toe gait in clinic  Psychiatric:        Attention and Perception: Attention and perception normal.        Mood and Affect: Mood and affect normal.        Speech: Speech normal.        Behavior: Behavior normal. Behavior is cooperative.        Thought Content: Thought content normal.        Cognition and Memory: Cognition and memory normal.        Judgment: Judgment normal.          Assessment & Plan:   A-plantar fasciitis right acute  P-Prednisone taper  10mg  with breakfast Day 1-2 take 30mg , day 3-4 take 20mg  and day 5-6 take 10mg  #21 RF0 dispensed from [facility] to patient.  Discussed if getting headache with prednisone side effect is increased blood pressure/heart rate and may need to decrease taper dose or increase hydrochlorothiazide temporarily.  Patient to notify clinic staff if headache develops.  Demonstrated stretches to patient in clinic.    May continue ibuprofen 800mg  po TID prn pain.  Discussed start wearing orthotics from podiatry again in her footwear.  [facility] handout on plantar fasciitis and plantar fasciitis rehab exercises printed and given to patient. I recommend achilles/gastrocnemius/foot/plantar stretches and icing 15 minutes at least nightly with frozen water bottle rolling foot over. Restart wearing podiatry shoe inserts as not much arch support in uggs.  Consider new supportive footwear/OTC inserts [employer] heel cups, shoe inserts OTC may purchase at their website or amazon.  If shoe treads worn out/greater than 53 year old  Typically shoes need to be replaced every 3-4 months depending on mileage (500 miles equals 10,000 steps per day) and if rotating with another pain.  Do not walk barefoot at home or thin leather no support sandals/flip flops/shoes as this can worsen condition/pain.  Consider compression socks/plantar  fasciitis socks and weight loss also. Patient verbalized understanding of instructions, agreed with plan of care and had no further questions at this time.

## 2021-04-15 NOTE — Patient Instructions (Addendum)
Plantar Fasciitis Rehab Ask your health care provider which exercises are safe for you. Do exercises exactly as told by your health care provider and adjust them as directed. It is normal to feel mild stretching, pulling, tightness, or discomfort as you do these exercises. Stop right away if you feel sudden pain or your pain gets worse. Do not begin these exercises until told by your health care provider. Stretching and range-of-motion exercises These exercises warm up your muscles and joints and improve the movement and flexibility of your foot. These exercises also help to relieve pain. Plantar fascia stretch  Sit with your left / right leg crossed over your opposite knee. Hold your heel with one hand with that thumb near your arch. With your other hand, hold your toes and gently pull them back toward the top of your foot. You should feel a stretch on the base (bottom) of your toes, or the bottom of your foot (plantar fascia), or both. Hold this stretch for_____15_____ seconds. Slowly release your toes and return to the starting position. Repeat _____3_____ times. Complete this exercise _____2_____ times a day. Gastrocnemius stretch, standing This exercise is also called a calf (gastroc) stretch. It stretches the muscles in the back of the upper calf. Stand with your hands against a wall. Extend your left / right leg behind you, and bend your front knee slightly. Keeping your heels on the floor, your toes facing forward, and your back knee straight, shift your weight toward the wall. Do not arch your back. You should feel a gentle stretch in your upper calf. Hold this position for ___15_______ seconds. Repeat _____3_____ times. Complete this exercise _____2_____ times a day. Soleus stretch, standing This exercise is also called a calf (soleus) stretch. It stretches the muscles in the back of the lower calf. Stand with your hands against a wall. Extend your left / right leg behind you, and bend  your front knee slightly. Keeping your heels on the floor and your toes facing forward, bend your back knee and shift your weight slightly over your back leg. You should feel a gentle stretch deep in your lower calf. Hold this position for ____15______ seconds. Repeat _____3_____ times. Complete this exercise ___2_______ times a day. Gastroc and soleus stretch, standing step This exercise stretches the muscles in the back of the lower leg. These muscles are in the upper calf (gastrocnemius) and the lower calf (soleus). Stand with the ball of your left / right foot on the front of a step. The ball of your foot is on the walking surface, right under your toes. Keep your other foot firmly on the same step. Hold on to the wall or a railing for balance. Slowly lift your other foot, allowing your body weight to press your heel down over the edge of the front of the step. Keep knee straight and unbent. You should feel a stretch in your calf. Hold this position for _____15_____ seconds. Return both feet to the step. Repeat this exercise with a slight bend in your left / right knee. Repeat ____3______ times with your left / right knee straight and ____3______ times with your left / right knee bent. Complete this exercise ____2______ times a day. Balance exercise This exercise builds your balance and strength control of your arch to help take pressure off your plantar fascia. Single leg stand If this exercise is too easy, you can try it with your eyes closed or while standing on a pillow. Without shoes, stand near a  railing or in a doorway. You may hold on to the railing or door frame as needed. Stand on your left / right foot. Keep your big toe down on the floor and lift the arch of your foot. You should feel a stretch across the bottom of your foot and your arch. Do not let your foot roll inward. Hold this position for ____15______ seconds. Repeat ____3______ times. Complete this exercise ____2______  times a day. This information is not intended to replace advice given to you by your health care provider. Make sure you discuss any questions you have with your health care provider. Document Revised: 01/25/2020 Document Reviewed: 01/25/2020 Elsevier Patient Education  2022 [employer]. Plantar Fasciitis Plantar fasciitis is a painful foot condition that affects the heel. It occurs when the band of tissue that connects the toes to the heel bone (plantar fascia) becomes irritated. This can happen as the result of exercising too much or doing other repetitive activities (overuse injury). Plantar fasciitis can cause mild irritation to severe pain that makes it difficult to walk or move. The pain is usually worse in the morning after sleeping, or after sitting or lying down for a period of time. Pain may also be worse after long periods of walking or standing. What are the causes? This condition may be caused by: Standing for long periods of time. Wearing shoes that do not have good arch support. Doing activities that put stress on joints (high-impact activities). This includes ballet and exercise that makes your heart beat faster (aerobic exercise), such as running. Being overweight. An abnormal way of walking (gait). Tight muscles in the back of your lower leg (calf). High arches in your feet or flat feet. Starting a new athletic activity. What are the signs or symptoms? The main symptom of this condition is heel pain. Pain may get worse after the following: Taking the first steps after a time of rest, especially in the morning after awakening, or after you have been sitting or lying down for a while. Long periods of standing still. Pain may decrease after 30-45 minutes of activity, such as gentle walking. How is this diagnosed? This condition may be diagnosed based on your medical history, a physical exam, and your symptoms. Your health care provider will check for: A tender area on the  bottom of your foot. A high arch in your foot or flat feet. Pain when you move your foot. Difficulty moving your foot. You may have imaging tests to confirm the diagnosis, such as: X-rays. Ultrasound. MRI. How is this treated? Treatment for plantar fasciitis depends on how severe your condition is. Treatment may include: Rest, ice, pressure (compression), and raising (elevating) the affected foot. This is called RICE therapy. Your health care provider may recommend RICE therapy along with over-the-counter pain medicines to manage your pain. Exercises to stretch your calves and your plantar fascia. A splint that holds your foot in a stretched, upward position while you sleep (night splint). Physical therapy to relieve symptoms and prevent problems in the future. Injections of steroid medicine (cortisone) to relieve pain and inflammation. Stimulating your plantar fascia with electrical impulses (extracorporeal shock wave therapy). This is usually the last treatment option before surgery. Surgery, if other treatments have not worked after 12 months. Follow these instructions at home: Managing pain, stiffness, and swelling  If directed, put ice on the painful area. To do this: Put ice in a plastic bag, or use a frozen bottle of water. Place a towel between  your skin and the bag or bottle. Roll the bottom of your foot over the bag or bottle. Do this for 20 minutes, 2-3 times a day. Wear athletic shoes that have air-sole or gel-sole cushions, or try soft shoe inserts that are designed for plantar fasciitis. Elevate your foot above the level of your heart while you are sitting or lying down. Activity Avoid activities that cause pain. Ask your health care provider what activities are safe for you. Do physical therapy exercises and stretches as told by your health care provider. Try activities and forms of exercise that are easier on your joints (low impact). Examples include swimming, water  aerobics, and biking. General instructions Take over-the-counter and prescription medicines only as told by your health care provider. Wear a night splint while sleeping, if told by your health care provider. Loosen the splint if your toes tingle, become numb, or turn cold and blue. Maintain a healthy weight, or work with your health care provider to lose weight as needed. Keep all follow-up visits. This is important. Contact a health care provider if you have: Symptoms that do not go away with home treatment. Pain that gets worse. Pain that affects your ability to move or do daily activities. Summary Plantar fasciitis is a painful foot condition that affects the heel. It occurs when the band of tissue that connects the toes to the heel bone (plantar fascia) becomes irritated. Heel pain is the main symptom of this condition. It may get worse after exercising too much or standing still for a long time. Treatment varies, but it usually starts with rest, ice, pressure (compression), and raising (elevating) the affected foot. This is called RICE therapy. Over-the-counter medicines can also be used to manage pain. This information is not intended to replace advice given to you by your health care provider. Make sure you discuss any questions you have with your health care provider. Document Revised: 07/31/2019 Document Reviewed: 07/31/2019 Elsevier Patient Education  2022 [employer].

## 2021-06-30 ENCOUNTER — Other Ambulatory Visit: Payer: Self-pay | Admitting: Physician Assistant

## 2021-06-30 DIAGNOSIS — D649 Anemia, unspecified: Secondary | ICD-10-CM

## 2021-07-02 ENCOUNTER — Inpatient Hospital Stay: Payer: No Typology Code available for payment source | Admitting: Physician Assistant

## 2021-07-02 ENCOUNTER — Inpatient Hospital Stay: Payer: No Typology Code available for payment source

## 2021-07-03 ENCOUNTER — Telehealth: Payer: Self-pay | Admitting: Registered Nurse

## 2021-07-03 NOTE — Telephone Encounter (Signed)
Patient notified RN Hildred Alamin on vacation and will return 3/14 and follow up with [facility] and hematology to see if repick panel can be completed at [facility].  Discussed with patient CBC and CMET can be completed onsite as due for annual Be Well insurance discount program also.  Patient verbalized understanding information/instructions and had no further questions at this time. ?

## 2021-07-08 NOTE — Telephone Encounter (Signed)
RN Hildred Alamin unable to draw reticulocyte count at [facility] but patient scheduled for CBC and executive panel fasting.  Message left for patient by RN Hildred Alamin today she is to contact hematology office regarding reticulocyte lab scheduling. ?

## 2021-07-17 ENCOUNTER — Telehealth: Payer: Self-pay | Admitting: Registered Nurse

## 2021-07-17 ENCOUNTER — Encounter: Payer: Self-pay | Admitting: Registered Nurse

## 2021-07-17 ENCOUNTER — Ambulatory Visit: Payer: Self-pay | Admitting: *Deleted

## 2021-07-17 VITALS — BP 135/81 | HR 64 | Ht 64.0 in | Wt 183.0 lb

## 2021-07-17 DIAGNOSIS — M7989 Other specified soft tissue disorders: Secondary | ICD-10-CM

## 2021-07-17 DIAGNOSIS — M722 Plantar fascial fibromatosis: Secondary | ICD-10-CM

## 2021-07-17 DIAGNOSIS — Z0000 Encounter for general adult medical examination without abnormal findings: Secondary | ICD-10-CM

## 2021-07-17 DIAGNOSIS — I10 Essential (primary) hypertension: Secondary | ICD-10-CM

## 2021-07-17 MED ORDER — HYDROCHLOROTHIAZIDE 12.5 MG PO TABS: 12.5000 mg | ORAL_TABLET | Freq: Every day | ORAL | 0 refills | Status: DC

## 2021-07-17 MED ORDER — IBUPROFEN 800 MG PO TABS: 800.0000 mg | ORAL_TABLET | Freq: Three times a day (TID) | ORAL | 0 refills | Status: AC | PRN

## 2021-07-17 MED ORDER — POTASSIUM CHLORIDE CRYS ER 20 MEQ PO TBCR: EXTENDED_RELEASE_TABLET | ORAL | 0 refills | Status: DC

## 2021-07-17 NOTE — Progress Notes (Signed)
Be Well insurance premium discount evaluation: Labs Drawn. Replacements ROI form signed. Tobacco Free Attestation form signed.  Forms placed in paper chart.  

## 2021-07-17 NOTE — Telephone Encounter (Signed)
Patient last filled 04/03/21.  Still has potassium remaining.  Labs drawn today for annual Be Well and is going to follow up with hematology after results available.  BP 135/81 BMI 31.4  Patient has been taking hydrochlorothiazide.  Gained a few pounds was 179 lbs on 2022 Be Well.  Encouraged weight loss, taking medication every day and follow up with hematology.  Patient verbalized understanding information/instructions, agreed with plan of care and had no further questions at this time. ?

## 2021-07-18 ENCOUNTER — Encounter: Payer: Self-pay | Admitting: Registered Nurse

## 2021-07-18 LAB — CMP12+LP+TP+TSH+6AC+CBC/D/PLT
ALT: 14 IU/L (ref 0–32)
AST: 17 IU/L (ref 0–40)
Albumin/Globulin Ratio: 1.5 (ref 1.2–2.2)
Albumin: 4 g/dL (ref 3.8–4.9)
Alkaline Phosphatase: 72 IU/L (ref 44–121)
BUN/Creatinine Ratio: 20 (ref 9–23)
BUN: 12 mg/dL (ref 6–24)
Basophils Absolute: 0 10*3/uL (ref 0.0–0.2)
Basos: 1 %
Bilirubin Total: 0.3 mg/dL (ref 0.0–1.2)
Calcium: 8.8 mg/dL (ref 8.7–10.2)
Chloride: 101 mmol/L (ref 96–106)
Chol/HDL Ratio: 3.1 ratio (ref 0.0–4.4)
Cholesterol, Total: 154 mg/dL (ref 100–199)
Creatinine, Ser: 0.61 mg/dL (ref 0.57–1.00)
EOS (ABSOLUTE): 0.1 10*3/uL (ref 0.0–0.4)
Eos: 1 %
Estimated CHD Risk: <0.5 times avg. (ref 0.0–1.0)
Free Thyroxine Index: 2.8 (ref 1.2–4.9)
GGT: 23 IU/L (ref 0–60)
Globulin, Total: 2.7 g/dL (ref 1.5–4.5)
Glucose: 84 mg/dL (ref 70–99)
HDL: 50 mg/dL (ref >39)
Hematocrit: 29.5 % — ABNORMAL LOW (ref 34.0–46.6)
Hemoglobin: 10.6 g/dL — ABNORMAL LOW (ref 11.1–15.9)
Immature Grans (Abs): 0 10*3/uL (ref 0.0–0.1)
Immature Granulocytes: 0 %
Iron: 59 ug/dL (ref 27–159)
LDH: 171 IU/L (ref 119–226)
LDL Chol Calc (NIH): 95 mg/dL (ref 0–99)
Lymphocytes Absolute: 1.7 10*3/uL (ref 0.7–3.1)
Lymphs: 30 %
MCH: 33.3 pg — ABNORMAL HIGH (ref 26.6–33.0)
MCHC: 35.9 g/dL — ABNORMAL HIGH (ref 31.5–35.7)
MCV: 93 fL (ref 79–97)
Monocytes Absolute: 0.3 10*3/uL (ref 0.1–0.9)
Monocytes: 6 %
Neutrophils Absolute: 3.7 10*3/uL (ref 1.4–7.0)
Neutrophils: 62 %
Phosphorus: 3.9 mg/dL (ref 3.0–4.3)
Platelets: 340 10*3/uL (ref 150–450)
Potassium: 4 mmol/L (ref 3.5–5.2)
RBC: 3.18 x10E6/uL — ABNORMAL LOW (ref 3.77–5.28)
RDW: 12.3 % (ref 11.7–15.4)
Sodium: 140 mmol/L (ref 134–144)
T3 Uptake Ratio: 29 % (ref 24–39)
T4, Total: 9.7 ug/dL (ref 4.5–12.0)
TSH: 0.817 u[IU]/mL (ref 0.450–4.500)
Total Protein: 6.7 g/dL (ref 6.0–8.5)
Triglycerides: 39 mg/dL (ref 0–149)
Uric Acid: 5 mg/dL (ref 3.0–7.2)
VLDL Cholesterol Cal: 9 mg/dL (ref 5–40)
WBC: 5.8 10*3/uL (ref 3.4–10.8)
eGFR: 105 mL/min/{1.73_m2} (ref >59)

## 2021-07-18 LAB — VITAMIN D 25 HYDROXY (VIT D DEFICIENCY, FRACTURES): Vit D, 25-Hydroxy: 34.8 ng/mL (ref 30.0–100.0)

## 2021-07-18 LAB — HGB A1C W/O EAG: Hgb A1c MFr Bld: 5.4 % (ref 4.8–5.6)

## 2021-07-18 NOTE — Progress Notes (Signed)
noted 

## 2021-07-18 NOTE — Progress Notes (Signed)
Results routed to [facility] provider PA and LPN Bonnita Nasuti that pt was previously communicating with regarding her labs.

## 2021-07-23 NOTE — Telephone Encounter (Signed)
Anemia slightly worsened on labs patient to follow up with hematology.  See results note. ?

## 2021-07-24 NOTE — Telephone Encounter (Signed)
Patient seen in workcenter today.  Has not followed up with hematology clinic to schedule appt.  Patient stated she would do so this week.  Reminded patient anemia worsening on most recent labs.  Patient verbalized understanding information/instructions and had no further questions at this time. ?

## 2021-08-07 ENCOUNTER — Ambulatory Visit: Payer: Self-pay | Admitting: Registered Nurse

## 2021-08-07 VITALS — BP 129/81 | HR 95 | Temp 98.9°F

## 2021-08-07 DIAGNOSIS — J00 Acute nasopharyngitis [common cold]: Secondary | ICD-10-CM

## 2021-08-07 DIAGNOSIS — H6983 Other specified disorders of Eustachian tube, bilateral: Secondary | ICD-10-CM

## 2021-08-07 MED ORDER — SALINE SPRAY 0.65 % NA SOLN: 2.0000 | NASAL | 0 refills | Status: DC

## 2021-08-07 NOTE — Patient Instructions (Signed)
Eustachian Tube Dysfunction ? ?Eustachian tube dysfunction refers to a condition in which a blockage develops in the narrow passage that connects the middle ear to the back of the nose (eustachian tube). The eustachian tube regulates air pressure in the middle ear by letting air move between the ear and nose. It also helps to drain fluid from the middle ear space. ?Eustachian tube dysfunction can affect one or both ears. When the eustachian tube does not function properly, air pressure, fluid, or both can build up in the middle ear. ?What are the causes? ?This condition occurs when the eustachian tube becomes blocked or cannot open normally. Common causes of this condition include: ?Ear infections. ?Colds and other infections that affect the nose, mouth, and throat (upper respiratory tract). ?Allergies. ?Irritation from cigarette smoke. ?Irritation from stomach acid coming up into the esophagus (gastroesophageal reflux). The esophagus is the part of the body that moves food from the mouth to the stomach. ?Sudden changes in air pressure, such as from descending in an airplane or scuba diving. ?Abnormal growths in the nose or throat, such as: ?Growths that line the nose (nasal polyps). ?Abnormal growth of cells (tumors). ?Enlarged tissue at the back of the throat (adenoids). ?What increases the risk? ?You are more likely to develop this condition if: ?You smoke. ?You are overweight. ?You are a child who has: ?Certain birth defects of the mouth, such as cleft palate. ?Large tonsils or adenoids. ?What are the signs or symptoms? ?Common symptoms of this condition include: ?A feeling of fullness in the ear. ?Ear pain. ?Clicking or popping noises in the ear. ?Ringing in the ear (tinnitus). ?Hearing loss. ?Loss of balance. ?Dizziness. ?Symptoms may get worse when the air pressure around you changes, such as when you travel to an area of high elevation, fly on an airplane, or go scuba diving. ?How is this diagnosed? ?This  condition may be diagnosed based on: ?Your symptoms. ?A physical exam of your ears, nose, and throat. ?Tests, such as those that measure: ?The movement of your eardrum. ?Your hearing (audiometry). ?How is this treated? ?Treatment depends on the cause and severity of your condition. ?In mild cases, you may relieve your symptoms by moving air into your ears. This is called "popping the ears." ?In more severe cases, or if you have symptoms of fluid in your ears, treatment may include: ?Medicines to relieve congestion (decongestants). ?Medicines that treat allergies (antihistamines). ?Nasal sprays or ear drops that contain medicines that reduce swelling (steroids). ?A procedure to drain the fluid in your eardrum. In this procedure, a small tube may be placed in the eardrum to: ?Drain the fluid. ?Restore the air in the middle ear space. ?A procedure to insert a balloon device through the nose to inflate the opening of the eustachian tube (balloon dilation). ?Follow these instructions at home: ?Lifestyle ?Do not do any of the following until your health care provider approves: ?Travel to high altitudes. ?Fly in airplanes. ?Work in a [employer] or room. ?Scuba dive. ?Do not use any products that contain nicotine or tobacco. These products include cigarettes, chewing tobacco, and vaping devices, such as e-cigarettes. If you need help quitting, ask your health care provider. ?Keep your ears dry. Wear fitted earplugs during showering and bathing. Dry your ears completely after. ?General instructions ?Take over-the-counter and prescription medicines only as told by your health care provider. ?Use techniques to help pop your ears as recommended by your health care provider. These may include: ?Chewing gum. ?Yawning. ?Frequent, forceful swallowing. ?  Closing your mouth, holding your nose closed, and gently blowing as if you are trying to blow air out of your nose. ?Keep all follow-up visits. This is important. ?Contact a  health care provider if: ?Your symptoms do not go away after treatment. ?Your symptoms come back after treatment. ?You are unable to pop your ears. ?You have: ?A fever. ?Pain in your ear. ?Pain in your head or neck. ?Fluid draining from your ear. ?Your hearing suddenly changes. ?You become very dizzy. ?You lose your balance. ?Get help right away if: ?You have a sudden, severe increase in any of your symptoms. ?Summary ?Eustachian tube dysfunction refers to a condition in which a blockage develops in the eustachian tube. ?It can be caused by ear infections, allergies, inhaled irritants, or abnormal growths in the nose or throat. ?Symptoms may include ear pain or fullness, hearing loss, or ringing in the ears. ?Mild cases are treated with techniques to unblock the ears, such as yawning or chewing gum. ?More severe cases are treated with medicines or procedures. ?This information is not intended to replace advice given to you by your health care provider. Make sure you discuss any questions you have with your health care provider. ?Document Revised: 06/24/2020 Document Reviewed: 06/24/2020 ?Elsevier Patient Education ? [employer]. ?How to Perform a Sinus Rinse ?A sinus rinse is a home treatment that is used to rinse your sinuses with a germ-free (sterile) mixture of salt and water (saline solution). Sinuses are air-filled spaces in your skull that are behind the bones of your face and forehead. They open into your nasal cavity. ?A sinus rinse can help to clear mucus, dirt, dust, or pollen from your nasal cavity. You may do a sinus rinse when you have a cold, a virus, nasal allergy symptoms, a sinus infection, or stuffiness in your nose or sinuses. ?What are the risks? ?A sinus rinse is generally safe and effective. However, there are a few risks, which include: ?A burning sensation in your sinuses. This may happen if you do not make the saline solution as directed. Be sure to follow all directions when making  the saline solution. ?Nasal irritation. ?Infection. This may be from unclean supplies or from contaminated water. Infection from contaminated water is rare, but possible. ?Do not do a sinus rinse if you have had ear or nasal surgery, ear infection, or plugged ears, unless recommended by your health care provider. ?Supplies needed: ?Saline solution or powder. ?Distilled or sterile water to mix with saline powder. ?You may use boiled and cooled tap water. Boil tap water for 5 minutes; cool until it is lukewarm. Use within 24 hours. ?Do not use regular tap water to mix with the saline solution. ?Neti pot or nasal rinse bottle. These supplies release the saline solution into your nose and through your sinuses. Neti pots and nasal rinse bottles can be purchased at [employer], a health food store, or online. ?How to perform a sinus rinse ? ?Wash your hands with soap and water for at least 20 seconds. If soap and water are not available, use hand sanitizer. ?Wash your device according to the directions that came with the product and then dry it. ?Use the solution that comes with your product or one that is sold separately in stores. Follow the mixing directions on the package to mix with sterile or distilled water. ?Fill the device with the amount of saline solution noted in the device instructions. ?Stand by a sink and tilt your head sideways  over the sink. ?Place the spout of the device in your upper nostril (the one closer to the ceiling). ?Gently pour or squeeze the saline solution into your nasal cavity. The liquid should drain out from the lower nostril if you are not too congested. ?While rinsing, breathe through your open mouth. ?Gently blow your nose to clear any mucus and rinse solution. Blowing too hard may cause ear pain. ?Turn your head in the other direction and repeat in your other nostril. ?Clean and rinse your device with clean water and then air-dry it. ?Talk with your health care provider or  pharmacist if you have questions about how to do a sinus rinse. ?Summary ?A sinus rinse is a home treatment that is used to rinse your sinuses with a sterile mixture of salt and water (saline solution). ?You

## 2021-08-07 NOTE — Progress Notes (Signed)
? ?Subjective:  ? ? Patient ID: Dawn Trevino, female    DOB: [birth year], 56 y.o.   MRN: [id number] ? ?56y/o african [place] female established patient here for evaluation right ear pain.  She has put cotton in it because when wind blows inside it worsens pain.  Has not been taking any allergy medication this spring.  Some post nasal drip and nasal congestion noted with spring bloom.  Denied cough/wheezing/ear discharge/n/v/d/dizziness/fever/chills. ? ? ? ? ?Review of Systems  ?Constitutional:  Negative for activity change, appetite change, chills, diaphoresis, fatigue and fever.  ?HENT:  Positive for congestion, ear pain, postnasal drip and rhinorrhea. Negative for dental problem, drooling, ear discharge, facial swelling, hearing loss, mouth sores, nosebleeds, sinus pressure, sinus pain, sneezing, sore throat, tinnitus, trouble swallowing and voice change.   ?Eyes:  Negative for photophobia and visual disturbance.  ?Respiratory:  Negative for cough, shortness of breath, wheezing and stridor.   ?Cardiovascular:  Negative for chest pain and palpitations.  ?Gastrointestinal:  Negative for diarrhea, nausea and vomiting.  ?Endocrine: Negative for cold intolerance and heat intolerance.  ?Genitourinary:  Negative for difficulty urinating.  ?Musculoskeletal:  Negative for gait problem, neck pain and neck stiffness.  ?Skin:  Negative for rash.  ?Allergic/Immunologic: Positive for environmental allergies. Negative for food allergies.  ?Neurological:  Negative for dizziness, tremors, seizures, syncope, facial asymmetry, speech difficulty, weakness, light-headedness, numbness and headaches.  ?Hematological:  Negative for adenopathy. Does not bruise/bleed easily.  ?Psychiatric/Behavioral:  Negative for agitation, confusion and sleep disturbance.   ? ?   ?Objective:  ? Physical Exam ?Vitals and nursing note reviewed.  ?Constitutional:   ?   General: She is awake. She is not in acute distress. ?   Appearance: Normal  appearance. She is well-developed and well-groomed. She is obese. She is not ill-appearing, toxic-appearing or diaphoretic.  ?HENT:  ?   Head: Normocephalic and atraumatic.  ?   Jaw: There is normal jaw occlusion. No trismus.  ?   Salivary Glands: Right salivary gland is not diffusely enlarged. Left salivary gland is not diffusely enlarged.  ?   Right Ear: Hearing, ear canal and external ear normal. No decreased hearing noted. Tenderness present. No drainage or swelling. A middle ear effusion is present. No foreign body. No mastoid tenderness. No PE tube. No hemotympanum. Tympanic membrane is not injected, scarred, perforated, erythematous or retracted.  ?   Left Ear: Hearing, ear canal and external ear normal. No decreased hearing noted. No drainage or tenderness. A middle ear effusion is present. No foreign body. No mastoid tenderness. No PE tube. No hemotympanum. Tympanic membrane is not injected, scarred, perforated, erythematous or retracted.  ?   Ears:  ?   Comments: Gold cerumen 90% auditory canal right and 40% left diameter ?   Nose: Mucosal edema, congestion and rhinorrhea present. No nasal deformity, septal deviation or laceration. Rhinorrhea is clear.  ?   Right Turbinates: Not enlarged, swollen or pale.  ?   Left Turbinates: Not enlarged, swollen or pale.  ?   Right Sinus: No maxillary sinus tenderness or frontal sinus tenderness.  ?   Left Sinus: No maxillary sinus tenderness or frontal sinus tenderness.  ?   Mouth/Throat:  ?   Lips: Pink. No lesions.  ?   Mouth: Mucous membranes are moist. Mucous membranes are not pale, not dry and not cyanotic. No lacerations, oral lesions or angioedema.  ?   Dentition: No dental abscesses or gum lesions.  ?   Tongue: No lesions.  Tongue does not deviate from midline.  ?   Palate: No mass and lesions.  ?   Pharynx: Uvula midline. Pharyngeal swelling and posterior oropharyngeal erythema present. No oropharyngeal exudate or uvula swelling.  ?   Tonsils: No tonsillar  exudate or tonsillar abscesses. 0 on the right. 0 on the left.  ?   Comments: Bilateral allergic shiners; bilateral air fluid level clear TMs intact without erythema; cobblestoning posterior pharynx ?Eyes:  ?   General: Lids are normal. Vision grossly intact. Gaze aligned appropriately. Allergic shiner present. No scleral icterus.    ?   Right eye: No foreign body, discharge or hordeolum.     ?   Left eye: No foreign body, discharge or hordeolum.  ?   Extraocular Movements: Extraocular movements intact.  ?   Right eye: Normal extraocular motion and no nystagmus.  ?   Left eye: Normal extraocular motion and no nystagmus.  ?   Conjunctiva/sclera: Conjunctivae normal.  ?   Right eye: Right conjunctiva is not injected. No chemosis, exudate or hemorrhage. ?   Left eye: Left conjunctiva is not injected. No chemosis, exudate or hemorrhage. ?   Pupils: Pupils are equal, round, and reactive to light. Pupils are equal.  ?   Right eye: Pupil is round and reactive.  ?   Left eye: Pupil is round and reactive.  ?Neck:  ?   Thyroid: No thyroid mass or thyromegaly.  ?   Trachea: Trachea and phonation normal. No tracheal tenderness or tracheal deviation.  ?Cardiovascular:  ?   Rate and Rhythm: Normal rate and regular rhythm.  ?   Pulses: Normal pulses.     ?     Radial pulses are 2+ on the right side and 2+ on the left side.  ?Pulmonary:  ?   Effort: Pulmonary effort is normal. No accessory muscle usage or respiratory distress.  ?   Breath sounds: Normal breath sounds and air entry. No stridor or transmitted upper airway sounds. No decreased breath sounds, wheezing, rhonchi or rales.  ?   Comments: Spoke full sentences without difficulty; no cough observed in clinic ?Chest:  ?   Chest wall: No tenderness.  ?Abdominal:  ?   General: There is no distension.  ?   Palpations: Abdomen is soft.  ?Musculoskeletal:     ?   General: No tenderness. Normal range of motion.  ?   Right shoulder: Normal.  ?   Left shoulder: Normal.  ?   Right  hand: Normal. Normal strength. Normal capillary refill.  ?   Left hand: Normal. Normal strength. Normal capillary refill.  ?   Cervical back: Normal range of motion and neck supple. No swelling, edema, deformity, erythema, signs of trauma, lacerations, rigidity, tenderness or crepitus. No pain with movement. Normal range of motion.  ?   Thoracic back: No swelling, edema, deformity, signs of trauma or lacerations. Normal range of motion.  ?   Right lower leg: No edema.  ?   Left lower leg: No edema.  ?Lymphadenopathy:  ?   Head:  ?   Right side of head: No submental, submandibular, tonsillar, preauricular, posterior auricular or occipital adenopathy.  ?   Left side of head: No submental, submandibular, tonsillar, preauricular, posterior auricular or occipital adenopathy.  ?   Cervical: No cervical adenopathy.  ?   Right cervical: No superficial, deep or posterior cervical adenopathy. ?   Left cervical: No superficial, deep or posterior cervical adenopathy.  ?Skin: ?   General:  Skin is warm and dry.  ?   Capillary Refill: Capillary refill takes less than 2 seconds.  ?   Coloration: Skin is not ashen, cyanotic, jaundiced, mottled, pale or sallow.  ?   Findings: No abrasion, abscess, acne, bruising, burn, ecchymosis, erythema, signs of injury, laceration, lesion, petechiae, rash or wound.  ?   Nails: There is no clubbing.  ?Neurological:  ?   General: No focal deficit present.  ?   Mental Status: She is alert and oriented to person, place, and time. Mental status is at baseline. She is not disoriented.  ?   GCS: GCS eye subscore is 4. GCS verbal subscore is 5. GCS motor subscore is 6.  ?   Cranial Nerves: Cranial nerves 2-12 are intact. No cranial nerve deficit, dysarthria or facial asymmetry.  ?   Sensory: Sensation is intact. No sensory deficit.  ?   Motor: Motor function is intact. No weakness, tremor, atrophy, abnormal muscle tone or seizure activity.  ?   Coordination: Coordination is intact. Coordination normal.   ?   Gait: Gait is intact. Gait normal.  ?   Comments: In/out of chair without difficulty; bilateral hand grasp equal 5/5; gait sure and steady in clinic  ?Psychiatric:     ?   Attention and Perception: Attention

## 2021-08-07 NOTE — Telephone Encounter (Signed)
Patient notified NP hematology appt scheduled 08/22/21 and they notified her labs would be drawn again. ?

## 2021-08-22 ENCOUNTER — Ambulatory Visit: Payer: No Typology Code available for payment source | Admitting: Physician Assistant

## 2021-08-22 ENCOUNTER — Encounter: Payer: Self-pay | Admitting: Registered Nurse

## 2021-08-22 ENCOUNTER — Inpatient Hospital Stay (HOSPITAL_BASED_OUTPATIENT_CLINIC_OR_DEPARTMENT_OTHER): Payer: No Typology Code available for payment source | Admitting: Physician Assistant

## 2021-08-22 ENCOUNTER — Other Ambulatory Visit: Payer: Self-pay

## 2021-08-22 ENCOUNTER — Telehealth: Payer: Self-pay | Admitting: Registered Nurse

## 2021-08-22 ENCOUNTER — Inpatient Hospital Stay: Payer: No Typology Code available for payment source | Attending: Physician Assistant

## 2021-08-22 VITALS — BP 115/61 | HR 86 | Temp 97.7°F | Resp 20 | Wt 184.3 lb

## 2021-08-22 DIAGNOSIS — D649 Anemia, unspecified: Secondary | ICD-10-CM | POA: Diagnosis not present

## 2021-08-22 DIAGNOSIS — E876 Hypokalemia: Secondary | ICD-10-CM

## 2021-08-22 DIAGNOSIS — Z79899 Other long term (current) drug therapy: Secondary | ICD-10-CM | POA: Insufficient documentation

## 2021-08-22 LAB — CBC WITH DIFFERENTIAL (CANCER CENTER ONLY)
Abs Immature Granulocytes: 0.01 10*3/uL (ref 0.00–0.07)
Basophils Absolute: 0 10*3/uL (ref 0.0–0.1)
Basophils Relative: 0 %
Eosinophils Absolute: 0.1 10*3/uL (ref 0.0–0.5)
Eosinophils Relative: 1 %
HCT: 32 % — ABNORMAL LOW (ref 36.0–46.0)
Hemoglobin: 10.5 g/dL — ABNORMAL LOW (ref 12.0–15.0)
Immature Granulocytes: 0 %
Lymphocytes Relative: 30 %
Lymphs Abs: 3.1 10*3/uL (ref 0.7–4.0)
MCH: 29.7 pg (ref 26.0–34.0)
MCHC: 32.8 g/dL (ref 30.0–36.0)
MCV: 90.4 fL (ref 80.0–100.0)
Monocytes Absolute: 0.6 10*3/uL (ref 0.1–1.0)
Monocytes Relative: 6 %
Neutro Abs: 6.4 10*3/uL (ref 1.7–7.7)
Neutrophils Relative %: 63 %
Platelet Count: 371 10*3/uL (ref 150–400)
RBC: 3.54 MIL/uL — ABNORMAL LOW (ref 3.87–5.11)
RDW: 12.3 % (ref 11.5–15.5)
WBC Count: 10.1 10*3/uL (ref 4.0–10.5)
nRBC: 0 % (ref 0.0–0.2)

## 2021-08-22 LAB — RETIC PANEL
Immature Retic Fract: 16.9 % — ABNORMAL HIGH (ref 2.3–15.9)
RBC.: 3.52 MIL/uL — ABNORMAL LOW (ref 3.87–5.11)
Retic Count, Absolute: 52.1 10*3/uL (ref 19.0–186.0)
Retic Ct Pct: 1.5 % (ref 0.4–3.1)
Reticulocyte Hemoglobin: 32.5 pg (ref >27.9)

## 2021-08-22 LAB — CMP (CANCER CENTER ONLY)
ALT: 11 U/L (ref 0–44)
AST: 15 U/L (ref 15–41)
Albumin: 4 g/dL (ref 3.5–5.0)
Alkaline Phosphatase: 68 U/L (ref 38–126)
Anion gap: 10 (ref 5–15)
BUN: 12 mg/dL (ref 6–20)
CO2: 32 mmol/L (ref 22–32)
Calcium: 9.2 mg/dL (ref 8.9–10.3)
Chloride: 98 mmol/L (ref 98–111)
Creatinine: 1.02 mg/dL — ABNORMAL HIGH (ref 0.44–1.00)
GFR, Estimated: >60 mL/min (ref >60)
Glucose, Bld: 96 mg/dL (ref 70–99)
Potassium: 3 mmol/L — ABNORMAL LOW (ref 3.5–5.1)
Sodium: 140 mmol/L (ref 135–145)
Total Bilirubin: 0.4 mg/dL (ref 0.3–1.2)
Total Protein: 7.5 g/dL (ref 6.5–8.1)

## 2021-08-22 NOTE — Progress Notes (Signed)
?[place] ?[phone]   [phone] ? ?PROGRESS NOTE ? ?Patient Care Team: ?Pcp, No as PCP - General ? ?CHIEF COMPLAINTS:  ?Normocytic Anemia ? ?HISTORY OF PRESENTING ILLNESS:  ?Dawn Trevino 56 y.o. female returns for a follow up for normocytic anemia. She is unaccompanied for this visit.  ? ?On exam today, Dawn Trevino reports her energy and appetite are stable. She continues to complete all her daily activities on her own. Patient denies nausea, vomiting or abdominal pain.  Her bowel movements are unchanged without any recurrent episodes of constipation or diarrhea. She denies any bleeding or constipation.  She denies easy bruising or signs of bleeding. She denies any fevers, chills, shortness of breath, chest pain or cough.  She has no other complaints.  Rest of the 10 point ROS is below. ? ?MEDICAL HISTORY:  ?Past Medical History:  ?Diagnosis Date  ? Abnormal Pap smear of cervix   ? 30 yrs ago  ? Anemia   ? Bacterial vaginosis   ? Hematuria   ? Infertility, female   ? Thyroid nodule   ? LEFT 7/09  ? ? ?SURGICAL HISTORY: ?Past Surgical History:  ?Procedure Laterality Date  ? CRYOTHERAPY    ? for abnormal pap smear 50yr ago  ? GYNECOLOGIC CRYOSURGERY  1987  ? abnormal pap - normal since  ? THYROID LOBECTOMY Right 2/95  ? ? ?SOCIAL HISTORY: ?Social History  ? ?Socioeconomic History  ? Marital status: Married  ?  Spouse name: Not on file  ? Number of children: 1  ? Years of education: Not on file  ? Highest education level: Not on file  ?Occupational History  ?  Employer: [employer]  ?Tobacco Use  ? Smoking status: Never  ? Smokeless tobacco: Never  ?Substance and Sexual Activity  ? Alcohol use: Yes  ?  Comment: rare  ? Drug use: No  ? Sexual activity: Yes  ?  Partners: Male  ?  Birth control/protection: None  ?Other Topics Concern  ? Not on file  ?Social History Narrative  ? Not on file  ? ?Social Determinants of Health  ? ?Financial Resource Strain: Not on file   ?Food Insecurity: Not on file  ?Transportation Needs: Not on file  ?Physical Activity: Not on file  ?Stress: Not on file  ?Social Connections: Not on file  ?Intimate Partner Violence: Not on file  ? ? ?FAMILY HISTORY: ?Family History  ?Problem Relation Age of Onset  ? Ovarian cancer Mother [phone] ?     ovarian cancer  ? Diabetes Mother   ? Prostate cancer Father [phone] ?     prostate cancer  ? Liver cancer Brother 524 ?     liver cancer  ? Kidney disease Sister   ?     both kidneys removed  ? Prostate cancer Brother   ? ? ?ALLERGIES:  has No Known Allergies. ? ?MEDICATIONS:  ?Current Outpatient Medications  ?Medication Sig Dispense Refill  ? hydrochlorothiazide (HYDRODIURIL) 12.5 MG tablet Take 1 tablet (12.5 mg total) by mouth daily. 90 tablet 0  ? levothyroxine (SYNTHROID) 75 MCG tablet Take 1 tablet (75 mcg total) by mouth daily before breakfast. 90 tablet 3  ? loratadine (CLARITIN) 10 MG tablet Take 1 tablet (10 mg total) by mouth daily. (Patient taking differently: Take 10 mg by mouth daily. As needed) 30 tablet 11  ? Multiple Vitamin (MULTI-VITAMIN PO) Take by mouth daily.    ? potassium chloride SA (KLOR-CON M)  20 MEQ tablet Take one daily with hydrochlorothiazide 30 tablet 0  ? Probiotic Product (PROBIOTIC PO) Take by mouth.    ? sodium chloride (OCEAN) 0.65 % SOLN nasal spray Place 2 sprays into both nostrils every 2 (two) hours while awake. (Patient taking differently: Place 2 sprays into both nostrils every 2 (two) hours while awake. As needed)  0  ? ?No current facility-administered medications for this visit.  ? ? ?REVIEW OF SYSTEMS:   ?Constitutional: ( - ) fevers, ( - )  chills , ( - ) night sweats ?Eyes: ( - ) blurriness of vision, ( - ) double vision, ( - ) watery eyes ?Ears, nose, mouth, throat, and face: ( - ) mucositis, ( - ) sore throat ?Respiratory: ( - ) cough, ( - ) dyspnea, ( - ) wheezes ?Cardiovascular: ( - ) palpitation, ( - ) chest discomfort, ( - ) lower extremity swelling ?Gastrointestinal:   ( - ) nausea, ( - ) heartburn, ( - ) change in bowel habits ?Skin: ( - ) abnormal skin rashes ?Lymphatics: ( - ) new lymphadenopathy, ( - ) easy bruising ?Neurological: ( - ) numbness, ( - ) tingling, ( - ) new weaknesses ?Behavioral/Psych: ( - ) mood change, ( - ) new changes  ?All other systems were reviewed with the patient and are negative. ? ?PHYSICAL EXAMINATION: ?ECOG PERFORMANCE STATUS: 0 - Asymptomatic ? ?Vitals:  ? 08/22/21 1537  ?BP: 115/61  ?Pulse: 86  ?Resp: 20  ?Temp: 97.7 ?F (36.5 ?C)  ?SpO2: 100%  ? ?Filed Weights  ? 08/22/21 1537  ?Weight: 184 lb 4.8 oz (83.6 kg)  ? ? ?GENERAL: well appearing female in NAD  ?SKIN: skin color, texture, turgor are normal, no rashes or significant lesions ?EYES: conjunctiva are pink and non-injected, sclera clear ?OROPHARYNX: no exudate, no erythema; lips, buccal mucosa, and tongue normal  ?LYMPH:  no palpable lymphadenopathy in the cervical or supraclavicular lymph nodes.  ?LUNGS: clear to auscultation and percussion with normal breathing effort ?HEART: regular rate & rhythm and no murmurs and no lower extremity edema ?Musculoskeletal: no cyanosis of digits and no clubbing  ?PSYCH: alert & oriented x 3, fluent speech ?NEURO: no focal motor/sensory deficits ? ?LABORATORY DATA:  ?I have reviewed the data as listed ? ?  Latest Ref Rng & Units 08/22/2021  ?  3:24 PM 07/17/2021  ?  9:58 AM 12/17/2020  ? 10:04 AM  ?CBC  ?WBC 4.0 - 10.5 K/uL 10.1   5.8   6.8    ?Hemoglobin 12.0 - 15.0 g/dL 10.5   10.6   11.2    ?Hematocrit 36.0 - 46.0 % 32.0   29.5   34.0    ?Platelets 150 - 400 K/uL 371   340   340    ? ? ? ?  Latest Ref Rng & Units 08/22/2021  ?  3:24 PM 07/17/2021  ?  9:58 AM 12/17/2020  ? 10:04 AM  ?CMP  ?Glucose 70 - 99 mg/dL 96   84   99    ?BUN 6 - 20 mg/dL '12   12   13    ' ?Creatinine 0.44 - 1.00 mg/dL 1.02   0.61   0.82    ?Sodium 135 - 145 mmol/L 140   140   143    ?Potassium 3.5 - 5.1 mmol/L 3.0   4.0   3.6    ?Chloride 98 - 111 mmol/L 98   101   104    ?CO2 22 -  32  mmol/L 32    29    ?Calcium 8.9 - 10.3 mg/dL 9.2   8.8   9.4    ?Total Protein 6.5 - 8.1 g/dL 7.5   6.7   7.8    ?Total Bilirubin 0.3 - 1.2 mg/dL 0.4   0.3   0.7    ?Alkaline Phos 38 - 126 U/L 68   72   73    ?AST 15 - 41 U/L '15   17   15    ' ?ALT 0 - 44 U/L '11   14   13    ' ? ? ?ASSESSMENT & PLAN ?Dawn Trevino is a 56 y.o. female who returns for a follow up for normocytic anemia.  ? ?#Normocytic Anemia: ?-Uncertain underlying cause but chronic in nature ?-Workup from August 2022 ruled out iron deficiency, nutritional deficiencies, copper deficiency, paraproteinemia, hemolysis.  ?-Labs today show mild decrease of Hgb levels from her baseline at 10.5. Rest of the CBC is unremarkable.  ?-Discussed continuing to monitor closely versus proceeding with bone marrow biopsy. Patient is feeling great so she is uncertain on next steps. I provided patient education handouts about bone marrow biopsy for her to review. Made tentative plan to repeat labs once a month to closely monitor levels.  ?-If Hgb drops <10, then recommend to proceed with BMB ?-RTC in 12 weeks to see Dr. Lorenso Courier.  ? ?#Hypokalemia: ?-Potassium level is 3.0 today, likely secondary to HCTZ  ?-She was prescribed potassium chloride 20 mEq once a day. She admits that she doesn't take it regularly due to large size of the tablet.  ?-Recommend to discuss with PCP about potassium chloride granules instead of tablet formulation  ? ?Orders Placed This Encounter  ?Procedures  ? CBC with Differential ([place] Only)  ?  Standing Status:   Standing  ?  Number of Occurrences:   3  ?  Standing Expiration Date:   08/23/2022  ? CMP ([place] only)  ?  Standing Status:   Standing  ?  Number of Occurrences:   3  ?  Standing Expiration Date:   08/23/2022  ? ? ?All questions were answered. The patient knows to call the clinic with any problems, questions or concerns. ? ?I have spent a total of 25 minutes minutes of face-to-face and non-face-to-face time, preparing  to see the patient, performing a medically appropriate examination, counseling and educating the patient, ordering tests, documenting clinical information in the electronic health record, and care coor

## 2021-08-23 MED ORDER — POTASSIUM CHLORIDE CRYS ER 20 MEQ PO TBCR: 20.0000 meq | EXTENDED_RELEASE_TABLET | Freq: Every day | ORAL | 0 refills | Status: DC

## 2021-08-23 NOTE — Telephone Encounter (Signed)
Patient contacted and confirmed has been hang difficulty swallowing potassium tablets.  Unsure of cost for granules/crystals with her insurance will need to ask pharmacy staff.  Patient reported has increased her intake of bananas.  Discussed with patient potatoes also rick in potassium can bake, steam, boil as frying adds saturated fats.  Recommend increasing intake of potassium rich foods e.g. banana, avocado, spinach, potatoes, sweet potatoes, orange, cantaloupe, tomatoes, yogurt, lentils, dried apricots, salmon, beans, rasisins, watermelon, broccoli, navy beans, swiss chard, beets, brussel sprouts, peas, acord squash, prunes, pumpkin, guava, kiwi fruit, pomegranate, and cherries.  Soup broths, Gatorade/powerade/pedialyte also have electrolytes in them. I recommend repeat potassium level in 2-4 weeks.  Follow up with a provider for re-evaluation if muscle cramps, palpitations, weakness, nausea, vomiting, diarrhea, fatigue, or syncope.    [facility] handout on hypokalemia sent to patient my chart.   ? ?Patient also stated still anemic and restarted taking her iron daily again also.  Notified me hematology recommended bone marrow biopsy if worsening of anemia and she was given information on bone marrow biopsy.  Discussed with patient if anemia worsening and taking iron every day then she knows is not from dietary iron deficiency.  Patient confirmed [facility] on [address] in [place] her preferred pharmacy and potassium crystals Rx 69mq po daily sent electronically #[id number].  Discussed drink beverage with straw or out of bottle to improve pill swallowing also.  Patient verbalized understanding information/instructions, agreed with plan of care and had no further questions at this time. ?

## 2021-08-26 NOTE — Telephone Encounter (Signed)
Patient reported she had not picked up crystals yet but had confirmed copay $10 and would be doing so today.  She had taken the regular potassium chloride pills the past couple of days "I got them down"  She still had some questions regarding anemia and iron.  Discussed with patient low iron can be one cause of anemia  e.g. not enough dietary intake but blood loss can be another cause e.g. GI tract, trauma, menses.  Another cause of anemia can be if bone marrow not making enough of the needed cells.  Patient still reviewing information from hematology regarding bone marrow biopsy but she stated she was going to proceed with biopsy if anemia worsening at next labs. ?

## 2021-08-29 NOTE — Telephone Encounter (Signed)
Patient seen 08/22/21 hematology/had labs.   ?#Normocytic Anemia: ?-Uncertain underlying cause but chronic in nature ?-Workup from August 2022 ruled out iron deficiency, nutritional deficiencies, copper deficiency, paraproteinemia, hemolysis.  ?-Labs today show mild decrease of Hgb levels from her baseline at 10.5. Rest of the CBC is unremarkable.  ?-Discussed continuing to monitor closely versus proceeding with bone marrow biopsy. Patient is feeling great so she is uncertain on next steps. I provided patient education handouts about bone marrow biopsy for her to review. Made tentative plan to repeat labs once a month to closely monitor levels.  ?-If Hgb drops <10, then recommend to proceed with BMB ?-RTC in 12 weeks to see Dr. Lorenso Courier.  ?  ?#Hypokalemia: ?-Potassium level is 3.0 today, likely secondary to HCTZ  ?-She was prescribed potassium chloride 20 mEq once a day. She admits that she doesn't take it regularly due to large size of the tablet.  ?-Recommend to discuss with PCP about potassium chloride granules instead of tablet formulation  ? ?New Rx sent to pharmacy for patient for potassium granules instead of tablets and patient stated she was going to pick up and start them but had been getting her tablets down. ?

## 2021-09-04 NOTE — Telephone Encounter (Signed)
Patient was seen by hematology epic note reviewed.  Patient has follow up visit scheduled. ?

## 2021-09-10 ENCOUNTER — Telehealth: Payer: Self-pay | Admitting: Hematology and Oncology

## 2021-09-10 NOTE — Telephone Encounter (Signed)
Called patient regarding upcoming appointment, patient is notified. °

## 2021-09-19 ENCOUNTER — Inpatient Hospital Stay: Payer: No Typology Code available for payment source

## 2021-09-19 ENCOUNTER — Other Ambulatory Visit: Payer: Self-pay

## 2021-09-19 ENCOUNTER — Inpatient Hospital Stay: Payer: No Typology Code available for payment source | Attending: Physician Assistant

## 2021-09-19 DIAGNOSIS — D649 Anemia, unspecified: Secondary | ICD-10-CM | POA: Diagnosis present

## 2021-09-19 DIAGNOSIS — E876 Hypokalemia: Secondary | ICD-10-CM | POA: Insufficient documentation

## 2021-09-19 LAB — CMP (CANCER CENTER ONLY)
ALT: 9 U/L (ref 0–44)
AST: 13 U/L — ABNORMAL LOW (ref 15–41)
Albumin: 3.9 g/dL (ref 3.5–5.0)
Alkaline Phosphatase: 67 U/L (ref 38–126)
Anion gap: 7 (ref 5–15)
BUN: 15 mg/dL (ref 6–20)
CO2: 30 mmol/L (ref 22–32)
Calcium: 9.4 mg/dL (ref 8.9–10.3)
Chloride: 102 mmol/L (ref 98–111)
Creatinine: 0.78 mg/dL (ref 0.44–1.00)
GFR, Estimated: >60 mL/min (ref >60)
Glucose, Bld: 101 mg/dL — ABNORMAL HIGH (ref 70–99)
Potassium: 3.7 mmol/L (ref 3.5–5.1)
Sodium: 139 mmol/L (ref 135–145)
Total Bilirubin: 0.4 mg/dL (ref 0.3–1.2)
Total Protein: 7.5 g/dL (ref 6.5–8.1)

## 2021-09-19 LAB — CBC WITH DIFFERENTIAL (CANCER CENTER ONLY)
Abs Immature Granulocytes: 0.01 10*3/uL (ref 0.00–0.07)
Basophils Absolute: 0 10*3/uL (ref 0.0–0.1)
Basophils Relative: 1 %
Eosinophils Absolute: 0.1 10*3/uL (ref 0.0–0.5)
Eosinophils Relative: 1 %
HCT: 32 % — ABNORMAL LOW (ref 36.0–46.0)
Hemoglobin: 10.5 g/dL — ABNORMAL LOW (ref 12.0–15.0)
Immature Granulocytes: 0 %
Lymphocytes Relative: 36 %
Lymphs Abs: 2.7 10*3/uL (ref 0.7–4.0)
MCH: 29.7 pg (ref 26.0–34.0)
MCHC: 32.8 g/dL (ref 30.0–36.0)
MCV: 90.4 fL (ref 80.0–100.0)
Monocytes Absolute: 0.5 10*3/uL (ref 0.1–1.0)
Monocytes Relative: 6 %
Neutro Abs: 4.1 10*3/uL (ref 1.7–7.7)
Neutrophils Relative %: 56 %
Platelet Count: 310 10*3/uL (ref 150–400)
RBC: 3.54 MIL/uL — ABNORMAL LOW (ref 3.87–5.11)
RDW: 12.6 % (ref 11.5–15.5)
WBC Count: 7.4 10*3/uL (ref 4.0–10.5)
nRBC: 0 % (ref 0.0–0.2)

## 2021-09-19 NOTE — Telephone Encounter (Signed)
Patient went to cancer center today to have labs drawn per RN Hildred Alamin discussion with patient

## 2021-09-22 NOTE — Progress Notes (Signed)
Repeat labs with hematology anemia stable and CMP essentially normal .4  10.1  5.8 R  6.8  8.6 R  6.3 R  7.8 R     RBC 3.87 - 5.11 MIL/uL 3.54 Low   3.54 Low   3.18 Low  R  3.74 Low   3.66 Low  R  3.35 Low  R  3.77 R, CM   Hemoglobin 12.0 - 15.0 g/dL 10.5 Low   10.5 Low   10.6 Low  R  11.2 Low   11.1 Low  R  11.0 Low  R  11.6 R   HCT 36.0 - 46.0 % 32.0 Low   32.0 Low   29.5 Low  R  34.0 Low   33.4 Low  R  31.2 Low  R  35.1 R   MCV 80.0 - 100.0 fL 90.4  90.4  93 R  90.9  91.3  93 R  93 R   MCH 26.0 - 34.0 pg 29.7  29.7  33.3 High  R  29.9  30.3 R  32.8 R  30.8 R   MCHC 30.0 - 36.0 g/dL 32.8  32.8  35.9 High  R  32.9  33.2 R  35.3 R  33.0 R   RDW 11.5 - 15.5 % 12.6  12.3  12.3 R  12.7  12.5 R  12.5 R  12.3 R   Platelet Count 150 - 400 K/uL 310  371  340 R  340  355 R  341 R  321 R   nRBC 0.0 - 0.2 % 0.0  0.0   0.0      Neutrophils Relative % % 56  63  62 R  63   57 R    Neutro Abs 1.7 - 7.7 K/uL 4.1  6.4  3.7 R  4.3   3.6 R    Lymphocytes Relative % 36  30   30      Lymphs Abs 0.7 - 4.0 K/uL 2.7  3.1  1.7 R  2.0   2.2 R    Monocytes Relative % '6  6   6      '$ Monocytes Absolute 0.1 - 1.0 K/uL 0.5  0.6   0.4      Eosinophils Relative % '1  1   1      '$ Eosinophils Absolute 0.0 - 0.5 K/uL 0.1  0.1   0.1      Basophils Relative % 1  0   0      Basophils Absolute 0.0 - 0.1 K/uL 0.0  0.0  0.0 R  0.0   0.0 R    Immature Granulocytes % 0  0  0 R  0   0 R    Abs Immature Granulocytes 0.00 - 0.07 K/uL 0.01  0.01 CM   0.01 CM      Comment: Performed at [facility], [address], [place], [place] [address]

## 2021-09-22 NOTE — Telephone Encounter (Signed)
Latest Reference Range & Units 08/22/21 15:24 09/19/21 13:31  Sodium 135 - 145 mmol/L 140 139  Potassium 3.5 - 5.1 mmol/L 3.0 (L) 3.7  Chloride 98 - 111 mmol/L 98 102  CO2 22 - 32 mmol/L 32 30  Glucose 70 - 99 mg/dL 96 101 (H)  BUN 6 - 20 mg/dL 12 15  Creatinine 0.44 - 1.00 mg/dL 1.02 (H) 0.78  Calcium 8.9 - 10.3 mg/dL 9.2 9.4  Anion gap 5 - '15  10 7  '$ Alkaline Phosphatase 38 - 126 U/L 68 67  Albumin 3.5 - 5.0 g/dL 4.0 3.9  AST 15 - 41 U/L 15 13 (L)  ALT 0 - 44 U/L 11 9  Total Protein 6.5 - 8.1 g/dL 7.5 7.5  Total Bilirubin 0.3 - 1.2 mg/dL 0.4 0.4  GFR, Est Non African American >60 mL/min >60 >60  WBC 4.0 - 10.5 K/uL 10.1 7.4  RBC 3.87 - 5.11 MIL/uL 3.54 (L) 3.54 (L)  Hemoglobin 12.0 - 15.0 g/dL 10.5 (L) 10.5 (L)  HCT 36.0 - 46.0 % 32.0 (L) 32.0 (L)  MCV 80.0 - 100.0 fL 90.4 90.4  MCH 26.0 - 34.0 pg 29.7 29.7  MCHC 30.0 - 36.0 g/dL 32.8 32.8  RDW 11.5 - 15.5 % 12.3 12.6  Platelets 150 - 400 K/uL 371 310  nRBC 0.0 - 0.2 % 0.0 0.0  Neutrophils % 63 56  Lymphocytes % 30 36  Monocytes Relative % 6 6  Eosinophil % 1 1  Basophil % 0 1  Immature Granulocytes % 0 0  NEUT# 1.7 - 7.7 K/uL 6.4 4.1  Lymphocyte # 0.7 - 4.0 K/uL 3.1 2.7  Monocyte # 0.1 - 1.0 K/uL 0.6 0.5  Eosinophils Absolute 0.0 - 0.5 K/uL 0.1 0.1  Basophils Absolute 0.0 - 0.1 K/uL 0.0 0.0  Abs Immature Granulocytes 0.00 - 0.07 K/uL 0.01 0.01  (L): Data is abnormally low (H): Data is abnormally high  Stable CBC potassium now normal along with kidney function Follow up with hematology chronic anemia.

## 2021-09-23 ENCOUNTER — Telehealth: Payer: Self-pay | Admitting: Physician Assistant

## 2021-09-23 ENCOUNTER — Telehealth: Payer: Self-pay

## 2021-09-23 NOTE — Telephone Encounter (Signed)
Spoke with pt regarding appt for BMBX per Dede Query, NP. Pt is aware and agreed to 6/28 at 0730. Procedure explained to pt and she verbalized thanks and understanding. Message sent to scheduling to get pt scheduled. Flowtometry aware.

## 2021-09-23 NOTE — Telephone Encounter (Signed)
I spoke to Ms. Sivley to review the lab results form 09/19/2021. Findings show persistent anemia with Hgb of 10.5 g/dL. Discussed continuing to monitor versus proceeding with bone marrow biopsy. Patient is agreeable to proceed with bone marrow biopsy. We will make arrange for biopsy at [facility]. Patient expressed understanding of the plan provided.

## 2021-10-09 NOTE — Telephone Encounter (Signed)
Patient reported bone marrow biopsy scheduled 10/22/21 with hematology labs similar to previous on recheck.  Patient nervous regarding biopsy procedure.  Patient had no further questions at this time.

## 2021-10-17 ENCOUNTER — Other Ambulatory Visit: Payer: No Typology Code available for payment source

## 2021-10-22 ENCOUNTER — Inpatient Hospital Stay: Payer: No Typology Code available for payment source

## 2021-10-22 ENCOUNTER — Inpatient Hospital Stay: Payer: No Typology Code available for payment source | Attending: Physician Assistant | Admitting: Adult Health

## 2021-10-22 ENCOUNTER — Other Ambulatory Visit: Payer: Self-pay

## 2021-10-22 VITALS — BP 126/75 | HR 66 | Temp 98.7°F | Resp 18 | Wt 187.5 lb

## 2021-10-22 DIAGNOSIS — D649 Anemia, unspecified: Secondary | ICD-10-CM

## 2021-10-22 DIAGNOSIS — D619 Aplastic anemia, unspecified: Secondary | ICD-10-CM

## 2021-10-22 LAB — CBC WITH DIFFERENTIAL (CANCER CENTER ONLY)
Abs Immature Granulocytes: 0.01 10*3/uL (ref 0.00–0.07)
Basophils Absolute: 0 10*3/uL (ref 0.0–0.1)
Basophils Relative: 1 %
Eosinophils Absolute: 0.1 10*3/uL (ref 0.0–0.5)
Eosinophils Relative: 2 %
HCT: 31.9 % — ABNORMAL LOW (ref 36.0–46.0)
Hemoglobin: 10.6 g/dL — ABNORMAL LOW (ref 12.0–15.0)
Immature Granulocytes: 0 %
Lymphocytes Relative: 33 %
Lymphs Abs: 2.1 10*3/uL (ref 0.7–4.0)
MCH: 30.1 pg (ref 26.0–34.0)
MCHC: 33.2 g/dL (ref 30.0–36.0)
MCV: 90.6 fL (ref 80.0–100.0)
Monocytes Absolute: 0.4 10*3/uL (ref 0.1–1.0)
Monocytes Relative: 7 %
Neutro Abs: 3.6 10*3/uL (ref 1.7–7.7)
Neutrophils Relative %: 57 %
Platelet Count: 308 10*3/uL (ref 150–400)
RBC: 3.52 MIL/uL — ABNORMAL LOW (ref 3.87–5.11)
RDW: 12.7 % (ref 11.5–15.5)
WBC Count: 6.2 10*3/uL (ref 4.0–10.5)
nRBC: 0 % (ref 0.0–0.2)

## 2021-10-22 LAB — CMP (CANCER CENTER ONLY)
ALT: 9 U/L (ref 0–44)
AST: 14 U/L — ABNORMAL LOW (ref 15–41)
Albumin: 3.8 g/dL (ref 3.5–5.0)
Alkaline Phosphatase: 67 U/L (ref 38–126)
Anion gap: 6 (ref 5–15)
BUN: 14 mg/dL (ref 6–20)
CO2: 31 mmol/L (ref 22–32)
Calcium: 9.2 mg/dL (ref 8.9–10.3)
Chloride: 103 mmol/L (ref 98–111)
Creatinine: 0.73 mg/dL (ref 0.44–1.00)
GFR, Estimated: >60 mL/min (ref >60)
Glucose, Bld: 101 mg/dL — ABNORMAL HIGH (ref 70–99)
Potassium: 3.6 mmol/L (ref 3.5–5.1)
Sodium: 140 mmol/L (ref 135–145)
Total Bilirubin: 0.4 mg/dL (ref 0.3–1.2)
Total Protein: 7.1 g/dL (ref 6.5–8.1)

## 2021-10-22 MED ORDER — LIDOCAINE HCL 2 % IJ SOLN
10.0000 mL | Freq: Once | INTRAMUSCULAR | Status: AC
  Administered 2021-10-22: 200 mg via INTRADERMAL
  Filled 2021-10-22: qty 20

## 2021-10-22 NOTE — Progress Notes (Signed)
INDICATION: refractory anemia  Brief examination was performed. ENT: adequate airway clearance Heart: regular rate and rhythm.No Murmurs Lungs: clear to auscultation, no wheezes, normal respiratory effort  Bone Marrow Biopsy and Aspiration Procedure Note   Informed consent was obtained and potential risks including bleeding, infection and pain were reviewed with the patient.  The patient's name, date of birth, identification, consent and allergies were verified prior to the start of procedure and time out was performed.  The left posterior iliac crest was chosen as the site of biopsy.  The skin was prepped with ChloraPrep.   15 cc of 2% lidocaine was used to provide local anaesthesia.   10 cc of bone marrow aspirate was obtained followed by 1cm biopsy.  Pressure was applied to the biopsy site and bandage was placed over the biopsy site. Patient was made to lie on the back for 30 mins prior to discharge.  Reviewed patient with Irene--she and Dr. Lorenso Courier do not wish to obtain hemoglobin electrophoresis since patient's anemia is not microcytic.     The procedure was tolerated well. COMPLICATIONS: None BLOOD LOSS: none The patient was discharged home in stable condition with a 1 week follow up to review results.  Patient was provided with post bone marrow biopsy instructions and instructed to call if there was any bleeding or worsening pain.  Specimens sent for flow cytometry, cytogenetics and additional studies.  Signed Scot Dock, NP

## 2021-10-22 NOTE — Progress Notes (Signed)
Patient tolerated BMX well. Went over after care instructions, patient stated understanding. VSS, no excessive bleeding noted at site. Patient ambulatory to lobby.

## 2021-10-22 NOTE — Patient Instructions (Signed)
Bone Marrow Aspiration and Bone Marrow Biopsy, Adult, Care After This sheet gives you information about how to care for yourself after your procedure. Your health care provider may also give you more specific instructions. If you have problems or questions, contact your health care provider. What can I expect after the procedure? After the procedure, it is common to have: Mild pain and tenderness. Swelling. Bruising. Follow these instructions at home: Puncture site care  Follow instructions from your health care provider about how to take care of the puncture site. Make sure you: Wash your hands with soap and water before and after you change your bandage (dressing). If soap and water are not available, use hand sanitizer. Change your dressing as told by your health care provider. Check your puncture site every day for signs of infection. Check for: More redness, swelling, or pain. Fluid or blood. Warmth. Pus or a bad smell. Activity Return to your normal activities as told by your health care provider. Ask your health care provider what activities are safe for you. Do not lift anything that is heavier than 10 lb (4.5 kg), or the limit that you are told, until your health care provider says that it is safe. Do not drive for 24 hours if you were given a sedative during your procedure. General instructions  Take over-the-counter and prescription medicines only as told by your health care provider. Do not take baths, swim, or use a hot tub until your health care provider approves. Ask your health care provider if you may take showers. You may only be allowed to take sponge baths. If directed, put ice on the affected area. To do this: Put ice in a plastic bag. Place a towel between your skin and the bag. Leave the ice on for 20 minutes, 2-3 times a day. Keep all follow-up visits as told by your health care provider. This is important. Contact a health care provider if: Your pain is not  controlled with medicine. You have a fever. You have more redness, swelling, or pain around the puncture site. You have fluid or blood coming from the puncture site. Your puncture site feels warm to the touch. You have pus or a bad smell coming from the puncture site. Summary After the procedure, it is common to have mild pain, tenderness, swelling, and bruising. Follow instructions from your health care provider about how to take care of the puncture site and what activities are safe for you. Take over-the-counter and prescription medicines only as told by your health care provider. Contact a health care provider if you have any signs of infection, such as fluid or blood coming from the puncture site. This information is not intended to replace advice given to you by your health care provider. Make sure you discuss any questions you have with your health care provider. Document Revised: 08/30/2018 Document Reviewed: 08/30/2018 Elsevier Patient Education  [place].

## 2021-10-24 LAB — SURGICAL PATHOLOGY

## 2021-10-29 ENCOUNTER — Telehealth: Payer: No Typology Code available for payment source | Admitting: Hematology and Oncology

## 2021-10-30 ENCOUNTER — Encounter (HOSPITAL_COMMUNITY): Payer: Self-pay

## 2021-10-31 ENCOUNTER — Inpatient Hospital Stay
Payer: No Typology Code available for payment source | Attending: Physician Assistant | Admitting: Hematology and Oncology

## 2021-10-31 DIAGNOSIS — D619 Aplastic anemia, unspecified: Secondary | ICD-10-CM

## 2021-10-31 DIAGNOSIS — D649 Anemia, unspecified: Secondary | ICD-10-CM | POA: Insufficient documentation

## 2021-10-31 NOTE — Progress Notes (Signed)
[place] [phone]   Fax:([phone]  PROGRESS NOTE  Patient Care Team: Pcp, No as PCP - General  CHIEF COMPLAINTS:  Normocytic Anemia  HISTORY OF PRESENTING ILLNESS:  Dawn Trevino 56 y.o. female returns for a follow up telephone visit for normocytic anemia.   On exam today, Ms. Sossamon reports the bone marrow biopsy procedure went well.  She notes she does not have any residual soreness or bruising after the time of the procedure.  She had no major changes in her health in the interim since her last visit.  She denies easy bruising or signs of bleeding. She denies any fevers, chills, shortness of breath, chest pain or cough.  She has no other complaints.  Rest of the 10 point ROS is below.  The bulk of our discussion focused on the results of the bone marrow biopsy and steps moving forward.  The patient reports that she understands that we have not found a clear etiology for her normocytic anemia and we will continue to monitor at this time.  MEDICAL HISTORY:  Past Medical History:  Diagnosis Date   Abnormal Pap smear of cervix    30 yrs ago   Anemia    Bacterial vaginosis    Hematuria    Infertility, female    Thyroid nodule    LEFT 7/09    SURGICAL HISTORY: Past Surgical History:  Procedure Laterality Date   CRYOTHERAPY     for abnormal pap smear 6yr ago   [facility]  abnormal pap - normal since   THYROID LOBECTOMY Right 2/95    SOCIAL HISTORY: Social History   Socioeconomic History   Marital status: Married    Spouse name: Not on file   Number of children: 1   Years of education: Not on file   Highest education level: Not on file  Occupational History    Employer: [employer]  Tobacco Use   Smoking status: Never   Smokeless tobacco: Never  Substance and Sexual Activity   Alcohol use: Yes    Comment: rare   Drug use: No   Sexual activity: Yes    Partners: Male    Birth  control/protection: None  Other Topics Concern   Not on file  Social History Narrative   Not on file   Social Determinants of Health   Financial Resource Strain: Not on file  Food Insecurity: Not on file  Transportation Needs: Not on file  Physical Activity: Not on file  Stress: Not on file  Social Connections: Not on file  Intimate Partner Violence: Not on file    FAMILY HISTORY: Family History  Problem Relation Age of Onset   Ovarian cancer Mother 659      ovarian cancer   Diabetes Mother    Prostate cancer Father 662      prostate cancer   Liver cancer Brother 52      liver cancer   Kidney disease Sister        both kidneys removed   Prostate cancer Brother     ALLERGIES:  has No Known Allergies.  MEDICATIONS:  Current Outpatient Medications  Medication Sig Dispense Refill   hydrochlorothiazide (HYDRODIURIL) 12.5 MG tablet Take 1 tablet (12.5 mg total) by mouth daily. 90 tablet 0   levothyroxine (SYNTHROID) 75 MCG tablet Take 1 tablet (75 mcg total) by mouth daily before breakfast. 90 tablet 3   loratadine (CLARITIN) 10 MG tablet Take 1  tablet (10 mg total) by mouth daily. (Patient taking differently: Take 10 mg by mouth daily. As needed) 30 tablet 11   Multiple Vitamin (MULTI-VITAMIN PO) Take by mouth daily.     potassium chloride SA (KLOR-CON M) 20 MEQ tablet Take 1 tablet (20 mEq total) by mouth daily. 30 tablet 0   Probiotic Product (PROBIOTIC PO) Take by mouth.     sodium chloride (OCEAN) 0.65 % SOLN nasal spray Place 2 sprays into both nostrils every 2 (two) hours while awake. (Patient taking differently: Place 2 sprays into both nostrils every 2 (two) hours while awake. As needed)  0   No current facility-administered medications for this visit.    REVIEW OF SYSTEMS:   Constitutional: ( - ) fevers, ( - )  chills , ( - ) night sweats Eyes: ( - ) blurriness of vision, ( - ) double vision, ( - ) watery eyes Ears, nose, mouth, throat, and face: ( - )  mucositis, ( - ) sore throat Respiratory: ( - ) cough, ( - ) dyspnea, ( - ) wheezes Cardiovascular: ( - ) palpitation, ( - ) chest discomfort, ( - ) lower extremity swelling Gastrointestinal:  ( - ) nausea, ( - ) heartburn, ( - ) change in bowel habits Skin: ( - ) abnormal skin rashes Lymphatics: ( - ) new lymphadenopathy, ( - ) easy bruising Neurological: ( - ) numbness, ( - ) tingling, ( - ) new weaknesses Behavioral/Psych: ( - ) mood change, ( - ) new changes  All other systems were reviewed with the patient and are negative.  PHYSICAL EXAMINATION: ECOG PERFORMANCE STATUS: 0 - Asymptomatic  No Physical Exam-- telephone visit.    LABORATORY DATA:  I have reviewed the data as listed    Latest Ref Rng & Units 10/22/2021    8:44 AM 09/19/2021    1:31 PM 08/22/2021    3:24 PM  CBC  WBC 4.0 - 10.5 K/uL 6.2  7.4  10.1   Hemoglobin 12.0 - 15.0 g/dL 10.6  10.5  10.5   Hematocrit 36.0 - 46.0 % 31.9  32.0  32.0   Platelets 150 - 400 K/uL 308  310  371        Latest Ref Rng & Units 10/22/2021    8:49 AM 09/19/2021    1:31 PM 08/22/2021    3:24 PM  CMP  Glucose 70 - 99 mg/dL 101  101  96   BUN 6 - 20 mg/dL '14  15  12   ' Creatinine 0.44 - 1.00 mg/dL 0.73  0.78  1.02   Sodium 135 - 145 mmol/L 140  139  140   Potassium 3.5 - 5.1 mmol/L 3.6  3.7  3.0   Chloride 98 - 111 mmol/L 103  102  98   CO2 22 - 32 mmol/L 31  30  32   Calcium 8.9 - 10.3 mg/dL 9.2  9.4  9.2   Total Protein 6.5 - 8.1 g/dL 7.1  7.5  7.5   Total Bilirubin 0.3 - 1.2 mg/dL 0.4  0.4  0.4   Alkaline Phos 38 - 126 U/L 67  67  68   AST 15 - 41 U/L '14  13  15   ' ALT 0 - 44 U/L '9  9  11     ' ASSESSMENT & PLAN Dawn Trevino is a 56 y.o. female who returns for a follow up for normocytic anemia.   #Normocytic Anemia: -Uncertain underlying cause but chronic  in nature -Workup from August 2022 ruled out iron deficiency, nutritional deficiencies, copper deficiency, paraproteinemia, hemolysis.  -Labs today show Hgb levels  at 10.6, approximately her baseline. Rest of the CBC is unremarkable.  -Bone marrow biopsy performed on 10/22/2021 showed no clear evidence of overt abnormalities or reason for her normocytic anemia. -RTC in 24 weeks to see Dr. Lorenso Courier.   #Hypokalemia: -Potassium level is 3.6 today, likely secondary to HCTZ  --continue to monitor   No orders of the defined types were placed in this encounter.   All questions were answered. The patient knows to call the clinic with any problems, questions or concerns.  I have spent a total of 25 minutes minutes of face-to-face and non-face-to-face time, preparing to see the patient, performing a medically appropriate examination, counseling and educating the patient, ordering tests, documenting clinical information in the electronic health record, and care coordination.   Ledell Peoples, MD Department of Hematology/Oncology [place] at [facility] Phone: [phone] Pager: [phone] Email: [email or web address]

## 2021-11-03 ENCOUNTER — Telehealth: Payer: Self-pay | Admitting: Hematology and Oncology

## 2021-11-03 NOTE — Telephone Encounter (Signed)
Scheduled per 7/7 los, message has been left with pt

## 2021-11-04 ENCOUNTER — Encounter: Payer: Self-pay | Admitting: Registered Nurse

## 2021-11-04 ENCOUNTER — Telehealth: Payer: Self-pay | Admitting: Registered Nurse

## 2021-11-04 NOTE — Telephone Encounter (Signed)
Patient notified NP bone marrow biopsy results did not find anything causing her anemia.  Has follow up labs in a month with hematology.  Epic pathology results reviewed with patient.  Encouraged patient to take her medications as prescribed and keep her follow up appts as scheduled.  Patient feeling well today stated did not require sedation for biopsy  gait sure and steady in warehouse/clinic.  Denied any further questions or concerns at this time.

## 2021-11-14 ENCOUNTER — Other Ambulatory Visit: Payer: No Typology Code available for payment source

## 2021-11-14 ENCOUNTER — Ambulatory Visit: Payer: No Typology Code available for payment source | Admitting: Hematology and Oncology

## 2022-01-29 ENCOUNTER — Telehealth: Payer: Self-pay | Admitting: Registered Nurse

## 2022-01-29 ENCOUNTER — Encounter: Payer: Self-pay | Admitting: Registered Nurse

## 2022-01-29 DIAGNOSIS — I10 Essential (primary) hypertension: Secondary | ICD-10-CM

## 2022-01-29 MED ORDER — POTASSIUM CHLORIDE CRYS ER 20 MEQ PO TBCR: 20.0000 meq | EXTENDED_RELEASE_TABLET | Freq: Every day | ORAL | 0 refills | Status: DC

## 2022-01-29 MED ORDER — HYDROCHLOROTHIAZIDE 12.5 MG PO TABS: 12.5000 mg | ORAL_TABLET | Freq: Every day | ORAL | 0 refills | Status: DC

## 2022-01-29 NOTE — Telephone Encounter (Signed)
Patient reported ran out of hydrochlorothiazide 12.'5mg'$  po daily yesterday needs [facility] fill today.  Last fill 10/14/21 per paper chart review from [facility].  Patient stated still has potassium pills at home doesn't need those refilled.    Labs  Latest Reference Range & Units 10/22/21 08:49  Sodium 135 - 145 mmol/L 140  Potassium 3.5 - 5.1 mmol/L 3.6  Chloride 98 - 111 mmol/L 103  CO2 22 - 32 mmol/L 31  Glucose 70 - 99 mg/dL 101 (H)  BUN 6 - 20 mg/dL 14  Creatinine 0.44 - 1.00 mg/dL 0.73  Calcium 8.9 - 10.3 mg/dL 9.2  Anion gap 5 - 15  6  Alkaline Phosphatase 38 - 126 U/L 67  Albumin 3.5 - 5.0 g/dL 3.8  AST 15 - 41 U/L 14 (L)  ALT 0 - 44 U/L 9  Total Protein 6.5 - 8.1 g/dL 7.1  Total Bilirubin 0.3 - 1.2 mg/dL 0.4  GFR, Est Non African American >60 mL/min >60  (H): Data is abnormally high (L): Data is abnormally low Next scheduled in 6 months with hematology.  Last BP check 115/61 HR 86 08/22/21  Bridge refilled today.  Will need new rx from [facility] at next visit.  Dispensed 90 tabs to patient today from [facility].  Patient to take 1 potassium pill with each hydrochlorothiazide tablet.  Patient verbalized understanding information/instructions, agreed with plan of care and had no further questions at this time.

## 2022-02-05 ENCOUNTER — Other Ambulatory Visit: Payer: Self-pay | Admitting: Obstetrics and Gynecology

## 2022-02-05 DIAGNOSIS — Z1231 Encounter for screening mammogram for malignant neoplasm of breast: Secondary | ICD-10-CM

## 2022-02-09 NOTE — Telephone Encounter (Signed)
Patient seen in workcenter stated next labs not due for 6 months after biopsy which was in July.  She has appt scheduled with hematology Jan 2024.  Patient asked if labs can be draw at [place] stated would need to see hematology orders if any special labs ordered that we do not carry tubes or special transport/handling required.  Patient verbalized understanding information/instructions and no further questions at this time.

## 2022-02-10 ENCOUNTER — Encounter: Payer: Self-pay | Admitting: Registered Nurse

## 2022-02-10 ENCOUNTER — Telehealth: Payer: Self-pay | Admitting: Registered Nurse

## 2022-02-10 DIAGNOSIS — M722 Plantar fascial fibromatosis: Secondary | ICD-10-CM

## 2022-02-12 MED ORDER — IBUPROFEN 800 MG PO TABS: 800.0000 mg | ORAL_TABLET | Freq: Three times a day (TID) | ORAL | 0 refills | Status: AC | PRN

## 2022-02-21 ENCOUNTER — Other Ambulatory Visit: Payer: Self-pay | Admitting: Obstetrics and Gynecology

## 2022-02-21 DIAGNOSIS — E039 Hypothyroidism, unspecified: Secondary | ICD-10-CM

## 2022-02-23 NOTE — Telephone Encounter (Signed)
Med refill request:Levothyroxine 75 mcg tab daily Last AEX: 11/25/20 -JJ Next AEX: Not scheduled  Last MMG (if hormonal med) 01/30/21, neg; scheduled 03/17/22   Rx pended for 30 day supply, no refills, with message to pharmacy, needs updated annual exam for future refills.    Routing to Dr. Talbert Nan

## 2022-02-23 NOTE — Telephone Encounter (Signed)
Late entry.  Patient wearing Ugg boots again without inserts to support arch and having plantar fasciitis flare again using ibuprofen '800mg'$  po TID prn.  Refilled today from [facility] dispensed to patient.

## 2022-02-24 NOTE — Telephone Encounter (Signed)
Spoke with patient. Advised per Dr. Talbert Nan.   AEX scheduled for 03/09/22 at 0800.   Patient verbalizes understanding and is agreeable.   Encounter closed.

## 2022-02-24 NOTE — Telephone Encounter (Signed)
Please schedule her soon for a lab visit for a TSH. She must have this prior to further refills (30 day supply sent).

## 2022-02-27 NOTE — Progress Notes (Signed)
56 y.o. G1P0001 Separated (since January) Black or [place] American Not Hispanic or Latino female here for annual exam.  No vaginal bleeding. No bowel or bladder c/o.     Patient's last menstrual period was 09/21/2017.          Sexually active: not currently The current method of family planning is post menopausal status.    Exercising: Yes.     Walking q other day 3 miles.  Smoker:  no  Health Maintenance: Pap:  10/18/18 Neg Hr HPV Neg,   08-27-15 neg HPV HR neg  History of abnormal Pap:  yes Cryo surgery over 30 years ago   MMG:  02/04/21 density D Bi-rads 1 neg, she has an appointment next week BMD:   none  Colonoscopy:  09/26/14 f/u 10 years TDaP:  08/11/2011 Gardasil: n/a   reports that she has never smoked. She has never used smokeless tobacco. She reports current alcohol use. She reports that she does not use drugs. She works in processing orders, lots of walking. Daughter lives locally, 3 grandchildren   Past Medical History:  Diagnosis Date   Abnormal Pap smear of cervix    30 yrs ago   Anemia    Bacterial vaginosis    Hematuria    Infertility, female    Thyroid nodule    LEFT 7/09    Past Surgical History:  Procedure Laterality Date   CRYOTHERAPY     for abnormal pap smear 46yr ago   [facility]  [place] - normal since   THYROID LOBECTOMY Right 2/95    Current Outpatient Medications  Medication Sig Dispense Refill   hydrochlorothiazide (HYDRODIURIL) 12.5 MG tablet Take 1 tablet (12.5 mg total) by mouth daily. 90 tablet 0   levothyroxine (SYNTHROID) 75 MCG tablet TAKE 1 TABLET(75 MCG) BY MOUTH DAILY BEFORE BREAKFAST 30 tablet 0   loratadine (CLARITIN) 10 MG tablet Take 1 tablet (10 mg total) by mouth daily. (Patient taking differently: Take 10 mg by mouth daily. As needed) 30 tablet 11   Multiple Vitamin (MULTI-VITAMIN PO) Take by mouth daily.     potassium chloride SA (KLOR-CON M) 20 MEQ tablet Take 1 tablet (20 mEq total) by mouth daily. 30  tablet 0   No current facility-administered medications for this visit.    Family History  Problem Relation Age of Onset   Ovarian cancer Mother 617      ovarian cancer   Diabetes Mother    Prostate cancer Father 629      prostate cancer   Liver cancer Brother 536      liver cancer   Kidney disease Sister        both kidneys removed   Prostate cancer Brother     Review of Systems  All other systems reviewed and are negative.   Exam:   BP 122/84 (BP Location: Left Arm, Patient Position: Sitting)   Pulse 85   Resp 18   Ht 5' 3.39" (1.61 m)   Wt 184 lb (83.5 kg)   LMP 09/21/2017   SpO2 96%   BMI 32.20 kg/m   Weight change: '@WEIGHTCHANGE'$ @ Height:   Height: 5' 3.39" (161 cm)  Ht Readings from Last 3 Encounters:  03/09/22 5' 3.39" (1.61 m)  07/17/21 '5\' 4"'$  (1.626 m)  12/17/20 '5\' 4"'$  (1.626 m)    General appearance: alert, cooperative and appears stated age Head: Normocephalic, without obvious abnormality, atraumatic Neck: no adenopathy, supple, symmetrical, trachea midline and  thyroid normal to inspection and palpation Lungs: clear to auscultation bilaterally Cardiovascular: regular rate and rhythm Breasts: normal appearance, no masses or tenderness Abdomen: soft, non-tender; non distended,  no masses,  no organomegaly Extremities: extremities normal, atraumatic, no cyanosis or edema Skin: Skin color, texture, turgor normal. No rashes or lesions Lymph nodes: Cervical, supraclavicular, and axillary nodes normal. No abnormal inguinal nodes palpated Neurologic: Grossly normal   Pelvic: External genitalia:  no lesions              Urethra:  normal appearing urethra with no masses, tenderness or lesions              Bartholins and Skenes: normal                 Vagina: normal appearing vagina with normal color and discharge, no lesions              Cervix: no lesions               Bimanual Exam:  Uterus:  normal size, contour, position, consistency, mobility,  non-tender              Adnexa: no mass, fullness, tenderness               Rectovaginal: Confirms               Anus:  normal sphincter tone, no lesions  Glorianne Manchester, RN chaperoned for the exam.  1. Well woman exam Discussed breast self exam Discussed calcium and vit D intake Screening labs through work  2. Hypothyroidism, unspecified type Needs refill on synthroid once TSH is back - TSH  3. Immunization due - Tdap vaccine greater than or equal to 7yo IM

## 2022-03-09 ENCOUNTER — Ambulatory Visit (INDEPENDENT_AMBULATORY_CARE_PROVIDER_SITE_OTHER): Payer: No Typology Code available for payment source | Admitting: Obstetrics and Gynecology

## 2022-03-09 ENCOUNTER — Encounter: Payer: Self-pay | Admitting: Obstetrics and Gynecology

## 2022-03-09 VITALS — BP 122/84 | HR 85 | Resp 18 | Ht 63.39 in | Wt 184.0 lb

## 2022-03-09 DIAGNOSIS — Z01419 Encounter for gynecological examination (general) (routine) without abnormal findings: Secondary | ICD-10-CM

## 2022-03-09 DIAGNOSIS — Z23 Encounter for immunization: Secondary | ICD-10-CM

## 2022-03-09 DIAGNOSIS — E039 Hypothyroidism, unspecified: Secondary | ICD-10-CM

## 2022-03-09 LAB — TSH: TSH: 0.97 mIU/L (ref 0.40–4.50)

## 2022-03-09 NOTE — Patient Instructions (Signed)

## 2022-03-10 ENCOUNTER — Other Ambulatory Visit: Payer: Self-pay | Admitting: Obstetrics and Gynecology

## 2022-03-10 DIAGNOSIS — E039 Hypothyroidism, unspecified: Secondary | ICD-10-CM

## 2022-03-10 MED ORDER — LEVOTHYROXINE SODIUM 75 MCG PO TABS: ORAL_TABLET | ORAL | 3 refills | Status: DC

## 2022-03-17 ENCOUNTER — Ambulatory Visit
Admission: RE | Admit: 2022-03-17 | Discharge: 2022-03-17 | Disposition: A | Discharge status: Home / Self Care | Payer: No Typology Code available for payment source | Source: Ambulatory Visit | Attending: Obstetrics and Gynecology | Admitting: Obstetrics and Gynecology

## 2022-03-17 DIAGNOSIS — Z1231 Encounter for screening mammogram for malignant neoplasm of breast: Secondary | ICD-10-CM

## 2022-04-28 ENCOUNTER — Telehealth: Payer: Self-pay | Admitting: Registered Nurse

## 2022-04-28 ENCOUNTER — Encounter: Payer: Self-pay | Admitting: Registered Nurse

## 2022-04-28 DIAGNOSIS — Z8639 Personal history of other endocrine, nutritional and metabolic disease: Secondary | ICD-10-CM

## 2022-04-28 DIAGNOSIS — I10 Essential (primary) hypertension: Secondary | ICD-10-CM

## 2022-04-28 MED ORDER — POTASSIUM CHLORIDE CRYS ER 20 MEQ PO TBCR: 20.0000 meq | EXTENDED_RELEASE_TABLET | Freq: Every day | ORAL | 0 refills | Status: DC

## 2022-04-28 MED ORDER — HYDROCHLOROTHIAZIDE 12.5 MG PO TABS: 12.5000 mg | ORAL_TABLET | Freq: Every day | ORAL | 0 refills | Status: DC

## 2022-04-29 NOTE — Telephone Encounter (Signed)
Patient requested refill of hydrochlorothiazide and potassium tablets.  Ran out of potassium 16 Apr 2022 and took last hydrochlorothiazide yesterday.  She thought she had one more bottle of each but could not find them at home yesterday.  Her brother died Thanksgiving unexpectedly from heart attack.  She just received notification of coroners report.  She was close with brother and they lived on same street.  Mother and her siblings did not celebrate/gather at family this year for Christmas which was atypical.  She stated did not put up holiday decorations either.  90 day supply potassium chloride ER 2mq po daily and hydrochlorothiazide 12.'5mg'$  po daily dispensed to patient from [facility].  BP/weight next week with RN KEvlyn Kannerafter taking medication consistently for 7 days.  [facility] labs due Mar 2024; last CBC Jun 2023 anemia persisted and has seen hematology.  Last vitamin D normal was low Mar 2022.  Vitamin D to be retested with [facility] labs.  Discussed with patient teledoc behavior health and EAP D[place] available to assist her with grieving brother's death if she needs to speak with someone.  Reminded patient no cost for these services/work benefit.  Patient verbalized understanding information/instructions, agreed with plan of care and had no further questions at this time.  A&Ox3 spoke full sentences without difficulty no headache/chest pain/dyspnea today.

## 2022-04-30 ENCOUNTER — Other Ambulatory Visit: Payer: No Typology Code available for payment source | Admitting: Occupational Medicine

## 2022-04-30 DIAGNOSIS — I10 Essential (primary) hypertension: Secondary | ICD-10-CM

## 2022-04-30 DIAGNOSIS — Z8639 Personal history of other endocrine, nutritional and metabolic disease: Secondary | ICD-10-CM

## 2022-04-30 NOTE — Progress Notes (Signed)
Lab drawn from Left AC tolerated well no issues noted.   

## 2022-05-01 LAB — CMP12+LP+TP+TSH+6AC+CBC/D/PLT

## 2022-05-02 LAB — CMP12+LP+TP+TSH+6AC+CBC/D/PLT
ALT: 10 IU/L (ref 0–32)
AST: 16 IU/L (ref 0–40)
Albumin/Globulin Ratio: 1.4 (ref 1.2–2.2)
Albumin: 4.3 g/dL (ref 3.8–4.9)
Alkaline Phosphatase: 86 IU/L (ref 44–121)
BUN/Creatinine Ratio: 17 (ref 9–23)
BUN: 13 mg/dL (ref 6–24)
Basophils Absolute: 0 10*3/uL (ref 0.0–0.2)
Basos: 1 %
Bilirubin Total: 0.4 mg/dL (ref 0.0–1.2)
Calcium: 9.7 mg/dL (ref 8.7–10.2)
Chloride: 100 mmol/L (ref 96–106)
Chol/HDL Ratio: 3.4 ratio (ref 0.0–4.4)
Cholesterol, Total: 176 mg/dL (ref 100–199)
Creatinine, Ser: 0.75 mg/dL (ref 0.57–1.00)
EOS (ABSOLUTE): 0.1 10*3/uL (ref 0.0–0.4)
Eos: 1 %
Estimated CHD Risk: 0.5 times avg. (ref 0.0–1.0)
Free Thyroxine Index: 2.2 (ref 1.2–4.9)
GGT: 22 IU/L (ref 0–60)
Globulin, Total: 3.1 g/dL (ref 1.5–4.5)
Glucose: 95 mg/dL (ref 70–99)
HDL: 52 mg/dL (ref >39)
Hematocrit: 35.6 % (ref 34.0–46.6)
Hemoglobin: 11.6 g/dL (ref 11.1–15.9)
Immature Grans (Abs): 0 10*3/uL (ref 0.0–0.1)
Immature Granulocytes: 0 %
Iron: 70 ug/dL (ref 27–159)
LDH: 144 IU/L (ref 119–226)
LDL Chol Calc (NIH): 116 mg/dL — ABNORMAL HIGH (ref 0–99)
Lymphocytes Absolute: 1.9 10*3/uL (ref 0.7–3.1)
Lymphs: 29 %
MCH: 30.1 pg (ref 26.6–33.0)
MCHC: 32.6 g/dL (ref 31.5–35.7)
MCV: 93 fL (ref 79–97)
Monocytes Absolute: 0.4 10*3/uL (ref 0.1–0.9)
Monocytes: 6 %
Neutrophils Absolute: 4 10*3/uL (ref 1.4–7.0)
Neutrophils: 63 %
Phosphorus: 3.8 mg/dL (ref 3.0–4.3)
Platelets: 364 10*3/uL (ref 150–450)
Potassium: 4.1 mmol/L (ref 3.5–5.2)
RBC: 3.85 x10E6/uL (ref 3.77–5.28)
RDW: 12.6 % (ref 11.7–15.4)
Sodium: 143 mmol/L (ref 134–144)
T3 Uptake Ratio: 25 % (ref 24–39)
T4, Total: 8.6 ug/dL (ref 4.5–12.0)
TSH: 2.86 u[IU]/mL (ref 0.450–4.500)
Total Protein: 7.4 g/dL (ref 6.0–8.5)
Triglycerides: 41 mg/dL (ref 0–149)
Uric Acid: 6.4 mg/dL (ref 3.0–7.2)
VLDL Cholesterol Cal: 8 mg/dL (ref 5–40)
WBC: 6.4 10*3/uL (ref 3.4–10.8)
eGFR: 93 mL/min/{1.73_m2} (ref >59)

## 2022-05-02 LAB — VITAMIN D 25 HYDROXY (VIT D DEFICIENCY, FRACTURES): Vit D, 25-Hydroxy: 33.5 ng/mL (ref 30.0–100.0)

## 2022-05-06 ENCOUNTER — Other Ambulatory Visit: Payer: No Typology Code available for payment source

## 2022-05-06 ENCOUNTER — Ambulatory Visit: Payer: No Typology Code available for payment source | Admitting: Hematology and Oncology

## 2022-05-06 ENCOUNTER — Inpatient Hospital Stay
Payer: No Typology Code available for payment source | Attending: Hematology and Oncology | Admitting: Hematology and Oncology

## 2022-05-06 DIAGNOSIS — D649 Anemia, unspecified: Secondary | ICD-10-CM

## 2022-05-06 NOTE — Progress Notes (Signed)
No able to be reached by phone. Will reschedule.

## 2022-05-07 ENCOUNTER — Telehealth: Payer: Self-pay | Admitting: Registered Nurse

## 2022-05-07 ENCOUNTER — Encounter: Payer: Self-pay | Admitting: Registered Nurse

## 2022-05-07 ENCOUNTER — Telehealth: Payer: Self-pay | Admitting: Hematology and Oncology

## 2022-05-07 DIAGNOSIS — D649 Anemia, unspecified: Secondary | ICD-10-CM

## 2022-05-07 DIAGNOSIS — Z0000 Encounter for general adult medical examination without abnormal findings: Secondary | ICD-10-CM

## 2022-05-07 DIAGNOSIS — Z8639 Personal history of other endocrine, nutritional and metabolic disease: Secondary | ICD-10-CM

## 2022-05-07 NOTE — Telephone Encounter (Signed)
Called patient to confirm new appointments. Patient scheduled and notified.

## 2022-05-07 NOTE — Telephone Encounter (Signed)
Patient with hematology appt scheduled 05/06/22 and had lab appt scheduled for this afternoon.  She is wondering if she needs to go to lab appt since we recently performed labs at [facility] clinic.  Instructed patient to contact her providers office.  Patient contacted office and stated they only required CBC which was done at [facility].  Hematology staff spoke with RN Evlyn Kanner and results in [place] and hematology provider will review and follow up with patient if she still needs to have further labs this afternoon at hematology.

## 2022-05-07 NOTE — Telephone Encounter (Signed)
Patient reported no further labs were required and her appt changed to telephone appt but she missed provider call and called back within one minute but only got voicemail for clinic.  This am clinic staff called and rescheduled her hematology appt to next week.  Patient wondering if she needs to have appt since anemia resolved on CBC.  I encouraged patient to keep her follow up appt with hematology.

## 2022-05-11 ENCOUNTER — Ambulatory Visit: Payer: No Typology Code available for payment source | Admitting: Occupational Medicine

## 2022-05-11 VITALS — BP 118/74

## 2022-05-11 DIAGNOSIS — I10 Essential (primary) hypertension: Secondary | ICD-10-CM

## 2022-05-13 ENCOUNTER — Inpatient Hospital Stay (HOSPITAL_BASED_OUTPATIENT_CLINIC_OR_DEPARTMENT_OTHER): Payer: No Typology Code available for payment source | Admitting: Physician Assistant

## 2022-05-13 DIAGNOSIS — D649 Anemia, unspecified: Secondary | ICD-10-CM

## 2022-05-13 NOTE — Progress Notes (Signed)
[place] Telephone:([phone]) [phone]   Fax:(336) [phone]  PROGRESS NOTE  Patient Care Team: Pcp, No as PCP - General Talbert Nan Francesca Jewett, MD as Consulting Physician (Obstetrics and Gynecology)  I connected with Alphonzo Severance Weilbacher  on 05/13/22 by telephone visit and verified that I am speaking with the correct person using two identifiers.   I discussed the limitations, risks, security and privacy concerns of performing an evaluation and management service by telemedicine and the availability of in-person appointments. I also discussed with the patient that there may be a patient responsible charge related to this service. The patient expressed understanding and agreed to proceed.  Patient's location: Home Provider's location: Office  CHIEF COMPLAINTS:  Normocytic Anemia  HISTORY OF PRESENTING ILLNESS:  Alphonzo Severance [place] 57 y.o. female returns for a follow up for normocytic anemia.  Ms. Fellows is feeling well and denies any new or concerning symptoms. Her energy levels are good and she is able to complete her ADLs on her own. She has a good appetite and denies any weight loss. She denies nausea, vomiting or abdominal pain. Bowel habits are unchanged. She denies easy bruising or signs of active bleeding. She denies easy bruising or signs of bleeding. She denies any fevers, chills, shortness of breath, chest pain or cough.  She has no other complaints.  Rest of the 10 point ROS is below.  MEDICAL HISTORY:  Past Medical History:  Diagnosis Date   Abnormal Pap smear of cervix    30 yrs ago   Anemia    Bacterial vaginosis    Hematuria    Infertility, female    Thyroid nodule    LEFT 7/09    SURGICAL HISTORY: Past Surgical History:  Procedure Laterality Date   CRYOTHERAPY     for abnormal pap smear 72yr ago   [facility]  abnormal pap - normal since   THYROID LOBECTOMY Right 2/95    SOCIAL HISTORY: Social History    Socioeconomic History   Marital status: Legally Separated    Spouse name: Not on file   Number of children: 1   Years of education: Not on file   Highest education level: Not on file  Occupational History    Employer: [employer]  Tobacco Use   Smoking status: Never   Smokeless tobacco: Never  Substance and Sexual Activity   Alcohol use: Yes    Comment: rare   Drug use: No   Sexual activity: Not Currently    Partners: Male    Birth control/protection: Post-menopausal  Other Topics Concern   Not on file  Social History Narrative   Not on file   Social Determinants of Health   Financial Resource Strain: Not on file  Food Insecurity: Not on file  Transportation Needs: Not on file  Physical Activity: Not on file  Stress: Not on file  Social Connections: Not on file  Intimate Partner Violence: Not on file    FAMILY HISTORY: Family History  Problem Relation Age of Onset   Ovarian cancer Mother 670      ovarian cancer   Diabetes Mother    Prostate cancer Father 639      prostate cancer   Liver cancer Brother 537      liver cancer   Kidney disease Sister        both kidneys removed   Prostate cancer Brother     ALLERGIES:  has No Known Allergies.  MEDICATIONS:  Current Outpatient Medications  Medication Sig Dispense Refill   hydrochlorothiazide (HYDRODIURIL) 12.5 MG tablet Take 1 tablet (12.5 mg total) by mouth daily. 90 tablet 0   levothyroxine (SYNTHROID) 75 MCG tablet TAKE 1 TABLET(75 MCG) BY MOUTH DAILY BEFORE BREAKFAST 90 tablet 3   loratadine (CLARITIN) 10 MG tablet Take 1 tablet (10 mg total) by mouth daily. (Patient taking differently: Take 10 mg by mouth daily. As needed) 30 tablet 11   Multiple Vitamin (MULTI-VITAMIN PO) Take by mouth daily.     potassium chloride SA (KLOR-CON M) 20 MEQ tablet Take 1 tablet (20 mEq total) by mouth daily. 90 tablet 0   No current facility-administered medications for this visit.    REVIEW OF SYSTEMS:    Constitutional: ( - ) fevers, ( - )  chills , ( - ) night sweats Eyes: ( - ) blurriness of vision, ( - ) double vision, ( - ) watery eyes Ears, nose, mouth, throat, and face: ( - ) mucositis, ( - ) sore throat Respiratory: ( - ) cough, ( - ) dyspnea, ( - ) wheezes Cardiovascular: ( - ) palpitation, ( - ) chest discomfort, ( - ) lower extremity swelling Gastrointestinal:  ( - ) nausea, ( - ) heartburn, ( - ) change in bowel habits Skin: ( - ) abnormal skin rashes Lymphatics: ( - ) new lymphadenopathy, ( - ) easy bruising Neurological: ( - ) numbness, ( - ) tingling, ( - ) new weaknesses Behavioral/Psych: ( - ) mood change, ( - ) new changes  All other systems were reviewed with the patient and are negative.  PHYSICAL EXAMINATION: Not performed due to virtual visit.   LABORATORY DATA:  I have reviewed the data as listed    Latest Ref Rng & Units 04/30/2022    8:35 AM 10/22/2021    8:44 AM 09/19/2021    1:31 PM  CBC  WBC 3.4 - 10.8 x10E3/uL 6.4  6.2  7.4   Hemoglobin 11.1 - 15.9 g/dL 11.6  10.6  10.5   Hematocrit 34.0 - 46.6 % 35.6  31.9  32.0   Platelets 150 - 450 x10E3/uL 364  308  310        Latest Ref Rng & Units 04/30/2022    8:35 AM 10/22/2021    8:49 AM 09/19/2021    1:31 PM  CMP  Glucose 70 - 99 mg/dL 95  101  101   BUN 6 - 24 mg/dL '13  14  15   '$ Creatinine 0.57 - 1.00 mg/dL 0.75  0.73  0.78   Sodium 134 - 144 mmol/L 143  140  139   Potassium 3.5 - 5.2 mmol/L 4.1  3.6  3.7   Chloride 96 - 106 mmol/L 100  103  102   CO2 22 - 32 mmol/L  31  30   Calcium 8.7 - 10.2 mg/dL 9.7  9.2  9.4   Total Protein 6.0 - 8.5 g/dL 7.4  7.1  7.5   Total Bilirubin 0.0 - 1.2 mg/dL 0.4  0.4  0.4   Alkaline Phos 44 - 121 IU/L 86  67  67   AST 0 - 40 IU/L '16  14  13   '$ ALT 0 - 32 IU/L '10  9  9     '$ ASSESSMENT & PLAN Mathea Frieling Dungee is a 57 y.o. female who returns for a follow up for normocytic anemia.   #Normocytic Anemia: -Uncertain underlying cause but chronic in nature -Workup  from  August 2022 ruled out iron deficiency, nutritional deficiencies, copper deficiency, paraproteinemia, hemolysis.  -Reviewed labs from 04/30/2022 which shows that anemia has resolved.  -She continues to take OTC iron pills every other day.  -Okay to monitor CBC every 6 months with PCP. We can see patient back if anemia returns. We can consider bone marrow biopsy if  Hgb drops <10.ey.   #Hypokalemia: -Potassium level from 04/30/2022 was normal, likely secondary to HCTZ  -Currently taking potassium chloride 20 mEq once a day.  No orders of the defined types were placed in this encounter.   All questions were answered. The patient knows to call the clinic with any problems, questions or concerns.  I have spent a total of 25 minutes minutes of non-face-to-face time, preparing to see the patient, counseling and educating the patient, ordering tests, documenting clinical information in the electronic health record, and care coordination.   Dede Query, PA-C Department of Hematology/Oncology [place] at [facility] Phone: [phone]

## 2022-06-03 NOTE — Telephone Encounter (Signed)
Epic reviewed had CMET Jan 2024.  A1c due Mar 2024 along with vitamin D.  CBC due June.  Be Well due prior to 30 Jul.  Will draw all labs in June and recheck BP/weight at that time.

## 2022-06-03 NOTE — Telephone Encounter (Signed)
Epic reviewed patient to have CBC every 6 months per hematology 05/13/22 with PCM now anemia resolved continue iron and potassium supplement while on hydrochlorothiazide.  RN Kimrey notified to schedule patient lab appt.

## 2022-07-16 ENCOUNTER — Ambulatory Visit: Payer: No Typology Code available for payment source | Admitting: Occupational Medicine

## 2022-07-16 ENCOUNTER — Encounter: Payer: Self-pay | Admitting: Registered Nurse

## 2022-07-16 ENCOUNTER — Telehealth: Payer: Self-pay | Admitting: Registered Nurse

## 2022-07-16 DIAGNOSIS — I10 Essential (primary) hypertension: Secondary | ICD-10-CM

## 2022-07-16 DIAGNOSIS — Z0000 Encounter for general adult medical examination without abnormal findings: Secondary | ICD-10-CM

## 2022-07-16 MED ORDER — POTASSIUM CHLORIDE CRYS ER 20 MEQ PO TBCR: 20.0000 meq | EXTENDED_RELEASE_TABLET | Freq: Every day | ORAL | 2 refills | Status: DC

## 2022-07-16 MED ORDER — HYDROCHLOROTHIAZIDE 12.5 MG PO TABS: 12.5000 mg | ORAL_TABLET | Freq: Every day | ORAL | 2 refills | Status: DC

## 2022-07-16 NOTE — Telephone Encounter (Signed)
Patient requested refill hydrochlorothiazide 12.5mg  po daily #90 RF2 and potassium chloride 2meq po daily #90 RF2 last filled 04/28/2022 Labs stable and BP 118/74 at 05/11/22 recheck with RN [facility]  Latest Reference Range & Units 04/30/22 08:35  Sodium 134 - 144 mmol/L 143  Potassium 3.5 - 5.2 mmol/L 4.1  Chloride 96 - 106 mmol/L 100  Glucose 70 - 99 mg/dL 95  BUN 6 - 24 mg/dL 13  Creatinine 0.57 - 1.00 mg/dL 0.75  Calcium 8.7 - 10.2 mg/dL 9.7  BUN/Creatinine Ratio 9 - 23  17  eGFR >59 mL/min/1.73 93  Phosphorus 3.0 - 4.3 mg/dL 3.8  Alkaline Phosphatase 44 - 121 IU/L 86  Albumin 3.8 - 4.9 g/dL 4.3  Albumin/Globulin Ratio 1.2 - 2.2  1.4  Uric Acid 3.0 - 7.2 mg/dL 6.4  AST 0 - 40 IU/L 16  ALT 0 - 32 IU/L 10  Total Protein 6.0 - 8.5 g/dL 7.4  Total Bilirubin 0.0 - 1.2 mg/dL 0.4  GGT 0 - 60 IU/L 22  Estimated CHD Risk 0.0 - 1.0 times avg. 0.5  LDH 119 - 226 IU/L 144  Total CHOL/HDL Ratio 0.0 - 4.4 ratio 3.4  Cholesterol, Total 100 - 199 mg/dL 176  HDL Cholesterol >39 mg/dL 52  Triglycerides 0 - 149 mg/dL 41  VLDL Cholesterol Cal 5 - 40 mg/dL 8  LDL Chol Calc (NIH) 0 - 99 mg/dL 116 (H)  Iron 27 - 159 ug/dL 70  Vitamin D, 25-Hydroxy 30.0 - 100.0 ng/mL 33.5  Globulin, Total 1.5 - 4.5 g/dL 3.1  WBC 3.4 - 10.8 x10E3/uL 6.4  RBC 3.77 - 5.28 x10E6/uL 3.85  Hemoglobin 11.1 - 15.9 g/dL 11.6  HCT 34.0 - 46.6 % 35.6  MCV 79 - 97 fL 93  MCH 26.6 - 33.0 pg 30.1  MCHC 31.5 - 35.7 g/dL 32.6  RDW 11.7 - 15.4 % 12.6  Platelets 150 - 450 x10E3/uL 364  Hematology Comments:  Note:  Neutrophils Not Estab. % 63  Immature Granulocytes Not Estab. % 0  NEUT# 1.4 - 7.0 x10E3/uL 4.0  Lymphocyte # 0.7 - 3.1 x10E3/uL 1.9  Monocytes Absolute 0.1 - 0.9 x10E3/uL 0.4  Basophils Absolute 0.0 - 0.2 x10E3/uL 0.0  Immature Grans (Abs) 0.0 - 0.1 x10E3/uL 0.0  Lymphs Not Estab. % 29  Monocytes Not Estab. % 6  Basos Not Estab. % 1  Eos Not Estab. % 1  EOS (ABSOLUTE) 0.0 - 0.4 x10E3/uL 0.1  TSH 0.450 -  4.500 uIU/mL 2.860  Thyroxine (T4) 4.5 - 12.0 ug/dL 8.6  Free Thyroxine Index 1.2 - 4.9  2.2  T3 Uptake Ratio 24 - 39 % 25  (H): Data is abnormally high

## 2022-07-16 NOTE — Progress Notes (Signed)
Be well insurance premium discount evaluation:    Patient completed PCM office visit epic reviewed by RN Kimrey and transcribed. Labs  Tobacco attestation signed. [facility] ROI formed signed. Forms placed in the chart.   Patient given handouts for [facility] pharmacies and discount drugs list,MyChart, Tele doc setup, Tele doc Behavioral, [place] counseling and [facility] counseling.  What to do for infectious illness protocol. Given handout for list of medications that can be filled at [facility]. Given Clinic hours and Clinic Email.  

## 2022-07-23 ENCOUNTER — Encounter: Payer: Self-pay | Admitting: Registered Nurse

## 2022-07-23 ENCOUNTER — Telehealth: Payer: Self-pay | Admitting: Registered Nurse

## 2022-07-23 DIAGNOSIS — M722 Plantar fascial fibromatosis: Secondary | ICD-10-CM

## 2022-07-23 MED ORDER — IBUPROFEN 800 MG PO TABS: 800.0000 mg | ORAL_TABLET | Freq: Three times a day (TID) | ORAL | 0 refills | Status: AC | PRN

## 2022-07-23 NOTE — Telephone Encounter (Signed)
Patient requested refill ibuprofen 800mg  po TID prn foot pain.  Last fill 02/12/2022 #30 from [facility].  Dispensed 30 tabs to patient today from [facility].  Labs stable 04/30/2022 normal renal and liver function.

## 2022-10-11 NOTE — Progress Notes (Signed)
Noted patient has had follow up labs 

## 2022-10-13 ENCOUNTER — Encounter: Payer: Self-pay | Admitting: Registered Nurse

## 2022-10-13 ENCOUNTER — Telehealth: Payer: Self-pay | Admitting: Registered Nurse

## 2022-10-13 DIAGNOSIS — T50905A Adverse effect of unspecified drugs, medicaments and biological substances, initial encounter: Secondary | ICD-10-CM

## 2022-10-13 DIAGNOSIS — M722 Plantar fascial fibromatosis: Secondary | ICD-10-CM

## 2022-10-13 MED ORDER — IBUPROFEN 800 MG PO TABS: 800.0000 mg | ORAL_TABLET | Freq: Three times a day (TID) | ORAL | 0 refills | Status: AC | PRN

## 2022-10-13 NOTE — Telephone Encounter (Signed)
Patient requested refill hydrochlorothiazide 12.5mg  po daily #90 RF2 and potassium chloride po daily #90 RF2 last filled 07/16/2022 and ibuprofen 800mg  po TID prn pain #30 RF0 07/23/22 using prn feet pain/plantar fascitis Labs stable and BP 118/74 at 05/11/22 recheck with RN Bess Kinds.  Patient had initially not requested hydrochlorothiazide stating had some pills left over but upon clarification less than 21 pills.   Latest Reference Range & Units 04/30/22 08:35  Sodium 134 - 144 mmol/L 143  Potassium 3.5 - 5.2 mmol/L 4.1  Chloride 96 - 106 mmol/L 100  Glucose 70 - 99 mg/dL 95  BUN 6 - 24 mg/dL 13  Creatinine 8.65 - 7.84 mg/dL 6.96  Calcium 8.7 - 29.5 mg/dL 9.7  BUN/Creatinine Ratio 9 - 23  17  eGFR >59 mL/min/1.73 93  Phosphorus 3.0 - 4.3 mg/dL 3.8  Alkaline Phosphatase 44 - 121 IU/L 86  Albumin 3.8 - 4.9 g/dL 4.3  Albumin/Globulin Ratio 1.2 - 2.2  1.4  Uric Acid 3.0 - 7.2 mg/dL 6.4  AST 0 - 40 IU/L 16  ALT 0 - 32 IU/L 10  Total Protein 6.0 - 8.5 g/dL 7.4  Total Bilirubin 0.0 - 1.2 mg/dL 0.4  GGT 0 - 60 IU/L 22  Estimated CHD Risk 0.0 - 1.0 times avg. 0.5  LDH 119 - 226 IU/L 144  Total CHOL/HDL Ratio 0.0 - 4.4 ratio 3.4  Cholesterol, Total 100 - 199 mg/dL 284  HDL Cholesterol >13 mg/dL 52  Triglycerides 0 - 244 mg/dL 41  VLDL Cholesterol Cal 5 - 40 mg/dL 8  LDL Chol Calc (NIH) 0 - 99 mg/dL 010 (H)  Iron 27 - 272 ug/dL 70  Vitamin D, 53-GUYQIHK 30.0 - 100.0 ng/mL 33.5  Globulin, Total 1.5 - 4.5 g/dL 3.1  WBC 3.4 - 74.2 V95G3/OV 6.4  RBC 3.77 - 5.28 x10E6/uL 3.85  Hemoglobin 11.1 - 15.9 g/dL 56.4  HCT 33.2 - 95.1 % 35.6  MCV 79 - 97 fL 93  MCH 26.6 - 33.0 pg 30.1  MCHC 31.5 - 35.7 g/dL 88.4  RDW 16.6 - 06.3 % 12.6  Platelets 150 - 450 x10E3/uL 364  Hematology Comments:   Note:  Neutrophils Not Estab. % 63  Immature Granulocytes Not Estab. % 0  NEUT# 1.4 - 7.0 x10E3/uL 4.0  Lymphocyte # 0.7 - 3.1 x10E3/uL 1.9  Monocytes Absolute 0.1 - 0.9 x10E3/uL 0.4  Basophils  Absolute 0.0 - 0.2 x10E3/uL 0.0  Immature Grans (Abs) 0.0 - 0.1 x10E3/uL 0.0  Lymphs Not Estab. % 29  Monocytes Not Estab. % 6  Basos Not Estab. % 1  Eos Not Estab. % 1  EOS (ABSOLUTE) 0.0 - 0.4 x10E3/uL 0.1  TSH 0.450 - 4.500 uIU/mL 2.860  Thyroxine (T4) 4.5 - 12.0 ug/dL 8.6  Free Thyroxine Index 1.2 - 4.9  2.2  T3 Uptake Ratio 24 - 39 % 25  (H): Data is abnormally high

## 2022-11-22 NOTE — Progress Notes (Signed)
Patient read receipt 05/03/22; patient met with RN Bess Kinds 07/16/22 discussed lab results for Be Well 2025 met discount requirementws 122/84 BP LDL 116 weight 184lbs; NP signed provider paperwork 07/21/22 and RN Kimrey notified HR team patient met FY 2025 insurance discount requirements.

## 2023-01-27 ENCOUNTER — Telehealth: Payer: Self-pay | Admitting: Registered Nurse

## 2023-01-27 ENCOUNTER — Encounter: Payer: Self-pay | Admitting: Registered Nurse

## 2023-01-27 DIAGNOSIS — T50905A Adverse effect of unspecified drugs, medicaments and biological substances, initial encounter: Secondary | ICD-10-CM | POA: Insufficient documentation

## 2023-01-27 DIAGNOSIS — I10 Essential (primary) hypertension: Secondary | ICD-10-CM

## 2023-01-27 NOTE — Telephone Encounter (Signed)
Patient requested refill hydrochlorothiazide 12.5mg  po daily  and potassium chloride po daily last filled 90 tabs 10/13/2022 has 7 tabs remaining at home  Labs stable and BP 118/74 at 05/11/22 recheck with RN Kimrey.    Dispensed 60 tabs each from [facility] to patient today from [facility].   Latest Reference Range & Units 04/30/22 08:35  Sodium 134 - 144 mmol/L 143  Potassium 3.5 - 5.2 mmol/L 4.1  Chloride 96 - 106 mmol/L 100  Glucose 70 - 99 mg/dL 95  BUN 6 - 24 mg/dL 13  Creatinine 4.09 - 8.11 mg/dL 9.14  Calcium 8.7 - 78.2 mg/dL 9.7  BUN/Creatinine Ratio 9 - 23  17  eGFR >59 mL/min/1.73 93  Phosphorus 3.0 - 4.3 mg/dL 3.8  Alkaline Phosphatase 44 - 121 IU/L 86  Albumin 3.8 - 4.9 g/dL 4.3  Albumin/Globulin Ratio 1.2 - 2.2  1.4  Uric Acid 3.0 - 7.2 mg/dL 6.4  AST 0 - 40 IU/L 16  ALT 0 - 32 IU/L 10  Total Protein 6.0 - 8.5 g/dL 7.4  Total Bilirubin 0.0 - 1.2 mg/dL 0.4  GGT 0 - 60 IU/L 22  Estimated CHD Risk 0.0 - 1.0 times avg. 0.5  LDH 119 - 226 IU/L 144  Total CHOL/HDL Ratio 0.0 - 4.4 ratio 3.4  Cholesterol, Total 100 - 199 mg/dL 956  HDL Cholesterol >21 mg/dL 52  Triglycerides 0 - 308 mg/dL 41  VLDL Cholesterol Cal 5 - 40 mg/dL 8  LDL Chol Calc (NIH) 0 - 99 mg/dL 657 (H)  Iron 27 - 846 ug/dL 70  Vitamin D, 96-EXBMWUX 30.0 - 100.0 ng/mL 33.5  Globulin, Total 1.5 - 4.5 g/dL 3.1  WBC 3.4 - 32.4 M01U2/VO 6.4  RBC 3.77 - 5.28 x10E6/uL 3.85  Hemoglobin 11.1 - 15.9 g/dL 53.6  HCT 64.4 - 03.4 % 35.6  MCV 79 - 97 fL 93  MCH 26.6 - 33.0 pg 30.1  MCHC 31.5 - 35.7 g/dL 74.2  RDW 59.5 - 63.8 % 12.6  Platelets 150 - 450 x10E3/uL 364  Hematology Comments:   Note:  Neutrophils Not Estab. % 63  Immature Granulocytes Not Estab. % 0  NEUT# 1.4 - 7.0 x10E3/uL 4.0  Lymphocyte # 0.7 - 3.1 x10E3/uL 1.9  Monocytes Absolute 0.1 - 0.9 x10E3/uL 0.4  Basophils Absolute 0.0 - 0.2 x10E3/uL 0.0  Immature Grans (Abs) 0.0 - 0.1 x10E3/uL 0.0  Lymphs Not Estab. % 29  Monocytes Not Estab.  % 6  Basos Not Estab. % 1  Eos Not Estab. % 1  EOS (ABSOLUTE) 0.0 - 0.4 x10E3/uL 0.1  TSH 0.450 - 4.500 uIU/mL 2.860  Thyroxine (T4) 4.5 - 12.0 ug/dL 8.6  Free Thyroxine Index 1.2 - 4.9  2.2  T3 Uptake Ratio 24 - 39 % 25  (H): Data is abnormally high

## 2023-03-15 ENCOUNTER — Ambulatory Visit: Payer: No Typology Code available for payment source | Admitting: Obstetrics and Gynecology

## 2023-03-17 ENCOUNTER — Other Ambulatory Visit (HOSPITAL_COMMUNITY)
Admission: RE | Admit: 2023-03-17 | Discharge: 2023-03-17 | Disposition: A | Discharge status: Home / Self Care | Payer: No Typology Code available for payment source | Source: Ambulatory Visit | Attending: Radiology | Admitting: Radiology

## 2023-03-17 ENCOUNTER — Ambulatory Visit (INDEPENDENT_AMBULATORY_CARE_PROVIDER_SITE_OTHER): Payer: No Typology Code available for payment source | Admitting: Radiology

## 2023-03-17 ENCOUNTER — Encounter: Payer: Self-pay | Admitting: Radiology

## 2023-03-17 VITALS — BP 124/76 | Ht 63.5 in | Wt 185.0 lb

## 2023-03-17 DIAGNOSIS — Z01419 Encounter for gynecological examination (general) (routine) without abnormal findings: Secondary | ICD-10-CM

## 2023-03-17 DIAGNOSIS — E039 Hypothyroidism, unspecified: Secondary | ICD-10-CM | POA: Diagnosis not present

## 2023-03-17 NOTE — Patient Instructions (Signed)
Preventive Care 40-58 Years Old, Female Preventive care refers to lifestyle choices and visits with your health care provider that can promote health and wellness. Preventive care visits are also called wellness exams. What can I expect for my preventive care visit? Counseling Your health care provider may ask you questions about your: Medical history, including: Past medical problems. Family medical history. Pregnancy history. Current health, including: Menstrual cycle. Method of birth control. Emotional well-being. Home life and relationship well-being. Sexual activity and sexual health. Lifestyle, including: Alcohol, nicotine or tobacco, and drug use. Access to firearms. Diet, exercise, and sleep habits. Work and work environment. Sunscreen use. Safety issues such as seatbelt and bike helmet use. Physical exam Your health care provider will check your: Height and weight. These may be used to calculate your BMI (body mass index). BMI is a measurement that tells if you are at a healthy weight. Waist circumference. This measures the distance around your waistline. This measurement also tells if you are at a healthy weight and may help predict your risk of certain diseases, such as type 2 diabetes and high blood pressure. Heart rate and blood pressure. Body temperature. Skin for abnormal spots. What immunizations do I need?  Vaccines are usually given at various ages, according to a schedule. Your health care provider will recommend vaccines for you based on your age, medical history, and lifestyle or other factors, such as travel or where you work. What tests do I need? Screening Your health care provider may recommend screening tests for certain conditions. This may include: Lipid and cholesterol levels. Diabetes screening. This is done by checking your blood sugar (glucose) after you have not eaten for a while (fasting). Pelvic exam and Pap test. Hepatitis B test. Hepatitis C  test. HIV (human immunodeficiency virus) test. STI (sexually transmitted infection) testing, if you are at risk. Lung cancer screening. Colorectal cancer screening. Mammogram. Talk with your health care provider about when you should start having regular mammograms. This may depend on whether you have a family history of breast cancer. BRCA-related cancer screening. This may be done if you have a family history of breast, ovarian, tubal, or peritoneal cancers. Bone density scan. This is done to screen for osteoporosis. Talk with your health care provider about your test results, treatment options, and if necessary, the need for more tests. Follow these instructions at home: Eating and drinking  Eat a diet that includes fresh fruits and vegetables, whole grains, lean protein, and low-fat dairy products. Take vitamin and mineral supplements as recommended by your health care provider. Do not drink alcohol if: Your health care provider tells you not to drink. You are pregnant, may be pregnant, or are planning to become pregnant. If you drink alcohol: Limit how much you have to 0-1 drink a day. Know how much alcohol is in your drink. In the [place] one drink equals one 12 oz bottle of beer (355 mL), one 5 oz glass of wine (148 mL), or one 1 oz glass of hard liquor (44 mL). Lifestyle Brush your teeth every morning and night with fluoride toothpaste. Floss one time each day. Exercise for at least 30 minutes 5 or more days each week. Do not use any products that contain nicotine or tobacco. These products include cigarettes, chewing tobacco, and vaping devices, such as e-cigarettes. If you need help quitting, ask your health care provider. Do not use drugs. If you are sexually active, practice safe sex. Use a condom or other form of protection to   prevent STIs. If you do not wish to become pregnant, use a form of birth control. If you plan to become pregnant, see your health care provider for a  prepregnancy visit. Take aspirin only as told by your health care provider. Make sure that you understand how much to take and what form to take. Work with your health care provider to find out whether it is safe and beneficial for you to take aspirin daily. Find healthy ways to manage stress, such as: Meditation, yoga, or listening to music. Journaling. Talking to a trusted person. Spending time with friends and family. Minimize exposure to UV radiation to reduce your risk of skin cancer. Safety Always wear your seat belt while driving or riding in a vehicle. Do not drive: If you have been drinking alcohol. Do not ride with someone who has been drinking. When you are tired or distracted. While texting. If you have been using any mind-altering substances or drugs. Wear a helmet and other protective equipment during sports activities. If you have firearms in your house, make sure you follow all gun safety procedures. Seek help if you have been physically or sexually abused. What's next? Visit your health care provider once a year for an annual wellness visit. Ask your health care provider how often you should have your eyes and teeth checked. Stay up to date on all vaccines. This information is not intended to replace advice given to you by your health care provider. Make sure you discuss any questions you have with your health care provider. Document Revised: 10/09/2020 Document Reviewed: 10/09/2020 Elsevier Patient Education  2024 Elsevier Inc.  

## 2023-03-17 NOTE — Progress Notes (Signed)
   Dawn Trevino July 22, 1965 [id number]   History: Postmenopausal 57 y.o. presents for annual exam. No gyn concerns. Does not have a PCP, labs at work. Needs thyroid checked before med refill.    Gynecologic History Postmenopausal Last Pap: 2020. Results were: normal Last mammogram: 11/23. Results were: normal Last colonoscopy: 2016 Sexually active: no  Obstetric History OB History  Gravida Para Term Preterm AB Living  1 1 0 0 0 1  SAB IAB Ectopic Multiple Live Births  0 0 0 0 1    # Outcome Date GA Lbr Len/2nd Weight Sex Type Anes PTL Lv  1 Para 1988    F CS-Classical   LIV     The following portions of the patient's history were reviewed and updated as appropriate: allergies, current medications, past family history, past medical history, past social history, past surgical history, and problem list.  Review of Systems Pertinent items noted in HPI and remainder of comprehensive ROS otherwise negative.  Past medical history, past surgical history, family history and social history were all reviewed and documented in the EPIC chart.  Exam:  Vitals:   03/17/23 1600  BP: 124/76  Weight: 185 lb (83.9 kg)  Height: 5' 3.5" (1.613 m)   Body mass index is 32.26 kg/m.  General appearance:  Normal Thyroid:  Symmetrical, normal in size, without palpable masses or nodularity. Respiratory  Auscultation:  Clear without wheezing or rhonchi Cardiovascular  Auscultation:  Regular rate, without rubs, murmurs or gallops  Edema/varicosities:  Not grossly evident Abdominal  Soft,nontender, without masses, guarding or rebound.  Liver/spleen:  No organomegaly noted  Hernia:  None appreciated  Skin  Inspection:  Grossly normal Breasts: Examined lying and sitting.   Right: Without masses, retractions, nipple discharge or axillary adenopathy.   Left: Without masses, retractions, nipple discharge or axillary adenopathy. Genitourinary   Inguinal/mons:  Normal without inguinal  adenopathy  External genitalia:  Normal appearing vulva with no masses, tenderness, or lesions  BUS/Urethra/Skene's glands:  Normal  Vagina:  Normal appearing with normal color and discharge, no lesions. Atrophy: moderate   Cervix:  Normal appearing without discharge or lesions  Uterus:  Normal in size, shape and contour.  Midline and mobile, nontender  Adnexa/parametria:     Rt: Normal in size, without masses or tenderness.   Lt: Normal in size, without masses or tenderness.  Anus and perineum: Normal    Raynelle Fanning, CMA present for exam  Assessment/Plan:   1. Well woman exam with routine gynecological exam - Cytology - PAP( [place]) - Schedule mammogram - Establish PCP  2. Acquired hypothyroidism - Thyroid Panel With TSH    Discussed SBE, colonoscopy and DEXA screening as directed. Recommend of exercise weekly, including weight bearing exercise. Encouraged the use of seatbelts and sunscreen.  Return in 1 year for annual or sooner prn.  Arlie Solomons B WHNP-BC, 4:07 PM 03/17/2023

## 2023-03-18 ENCOUNTER — Encounter: Payer: Self-pay | Admitting: Registered Nurse

## 2023-03-18 ENCOUNTER — Telehealth: Payer: Self-pay | Admitting: Registered Nurse

## 2023-03-18 ENCOUNTER — Other Ambulatory Visit: Payer: Self-pay | Admitting: Radiology

## 2023-03-18 DIAGNOSIS — Z23 Encounter for immunization: Secondary | ICD-10-CM

## 2023-03-18 DIAGNOSIS — Z1231 Encounter for screening mammogram for malignant neoplasm of breast: Secondary | ICD-10-CM

## 2023-03-18 DIAGNOSIS — E890 Postprocedural hypothyroidism: Secondary | ICD-10-CM

## 2023-03-18 LAB — THYROID PANEL WITH TSH
Free Thyroxine Index: 2.7 (ref 1.4–3.8)
T3 Uptake: 30 % (ref 22–35)
T4, Total: 9.1 ug/dL (ref 5.1–11.9)
TSH: 0.33 m[IU]/L — ABNORMAL LOW (ref 0.40–4.50)

## 2023-03-18 MED ORDER — LEVOTHYROXINE SODIUM 50 MCG PO TABS
50.0000 ug | ORAL_TABLET | Freq: Every day | ORAL | 0 refills | Status: DC
Start: 2023-03-18 — End: 2023-09-07

## 2023-03-18 NOTE — Telephone Encounter (Signed)
Patient had labs with women's health this week levothyroxine dose decreased to po daily and recheck TSH in 12 weeks.  90 tabs dispensed to patient today from [facility].  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  Has been on since 2014. Recent weight gain and taking medication at different times of day may have affected absorption.  Previously was taking at same time every morning before breakfast. Patient notified of instructions from her provider had not seen my chart yet.  Patient had come to clinic to discuss visit with NP as had a lot of pain with PAP/pelvic examination yesterday and patient stated did not have a very thorough annual visit re labs/discuss preventive care.  Her PCM has retired and she needs to establish with a new PCM.  Discussed how to schedule on my chart with new PCM and to schedule now as [facility] visits have backlog in community will not be until January in most offices with scheduling now.  Discussed she can get covid booster here at [facility] this month have in stock currently along with flu. Patient stated last covid vaccine Jan 2024 discussed new formula became available for this covid season Sep 2024 and booster recommended.   Patient verbalized understanding information/instructions agreed with plan of care and had no further questions at this time.  Latest Reference Range & Units 03/09/22 08:44 04/30/22 08:35 03/17/23 16:17  TSH 0.40 - 4.50 mIU/L 0.97 2.860 0.33 (L)  Thyroxine (T4) 5.1 - 11.9 mcg/dL  8.6 9.1  Free Thyroxine Index 1.4 - 3.8   2.2 2.7  T3 Uptake Ratio 24 - 39 %  25   T3 Uptake 22 - 35 %   30  (L): Data is abnormally low

## 2023-03-22 LAB — CYTOLOGY - PAP
Adequacy: ABSENT
Comment: NEGATIVE
Diagnosis: NEGATIVE
High risk HPV: NEGATIVE

## 2023-04-06 ENCOUNTER — Encounter: Payer: Self-pay | Admitting: Registered Nurse

## 2023-04-06 ENCOUNTER — Telehealth: Payer: Self-pay | Admitting: Registered Nurse

## 2023-04-06 DIAGNOSIS — M549 Dorsalgia, unspecified: Secondary | ICD-10-CM

## 2023-04-06 NOTE — Telephone Encounter (Signed)
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  Discussed stretching/heat also.  Ibuprofen 800mg  po TID prn pain #90 RF0 given to patient from [facility] today.  Last fill 10/13/2022 90 tabs.  Patient verbalized understanding information/instructions, agreed with plan of care and stated she plans to buy heating pad.

## 2023-04-09 ENCOUNTER — Telehealth: Payer: Self-pay | Admitting: Registered Nurse

## 2023-04-09 ENCOUNTER — Encounter: Payer: Self-pay | Admitting: Registered Nurse

## 2023-04-09 DIAGNOSIS — Z0000 Encounter for general adult medical examination without abnormal findings: Secondary | ICD-10-CM

## 2023-04-09 DIAGNOSIS — I10 Essential (primary) hypertension: Secondary | ICD-10-CM

## 2023-04-09 MED ORDER — HYDROCHLOROTHIAZIDE 12.5 MG PO TABS: 12.5000 mg | ORAL_TABLET | Freq: Every day | ORAL | 0 refills | Status: DC

## 2023-04-09 NOTE — Telephone Encounter (Signed)
Patient notified me took her last hydrochlorothiazide 12.5mg  tab last night and needs refill.  Not onsite until 17 Dec for [facility] dispense; electronic Rx sent to her pharmacy of choice #30 RF0 patient verbalized understanding information and had no further questions at this time.

## 2023-04-12 ENCOUNTER — Telehealth: Payer: Self-pay | Admitting: Registered Nurse

## 2023-04-13 NOTE — Telephone Encounter (Signed)
Patient left message for NP that she still does want to have covid vaccination at [facility] and will schedule with RN

## 2023-04-14 MED ORDER — HYDROCHLOROTHIAZIDE 12.5 MG PO TABS: 12.5000 mg | ORAL_TABLET | Freq: Every day | ORAL | 0 refills | Status: DC

## 2023-04-14 NOTE — Telephone Encounter (Signed)
Dispensed 90 tabs hydrochlorothiazide 12.5mg  po daily  from [facility] to patient today last fill 60 tabs 01/26/2023.  Patient stated has potassium chloride tabs still at home no refill needed last filled 60 01/26/2023 also.  Fasting labs due Jan 2025 for Be Well 2026 Labs stable and BP 118/74 at 05/11/22 recheck with RN Kimrey  Patient verbalized understanding information/instructions, agreed with plan of care and had no further questions at this time..     Latest Reference Range & Units 04/30/22 08:35  Sodium 134 - 144 mmol/L 143  Potassium 3.5 - 5.2 mmol/L 4.1  Chloride 96 - 106 mmol/L 100  Glucose 70 - 99 mg/dL 95  BUN 6 - 24 mg/dL 13  Creatinine 7.82 - 9.56 mg/dL 2.13  Calcium 8.7 - 08.6 mg/dL 9.7  BUN/Creatinine Ratio 9 - 23  17  eGFR >59 mL/min/1.73 93  Phosphorus 3.0 - 4.3 mg/dL 3.8  Alkaline Phosphatase 44 - 121 IU/L 86  Albumin 3.8 - 4.9 g/dL 4.3  Albumin/Globulin Ratio 1.2 - 2.2  1.4  Uric Acid 3.0 - 7.2 mg/dL 6.4  AST 0 - 40 IU/L 16  ALT 0 - 32 IU/L 10  Total Protein 6.0 - 8.5 g/dL 7.4  Total Bilirubin 0.0 - 1.2 mg/dL 0.4  GGT 0 - 60 IU/L 22  Estimated CHD Risk 0.0 - 1.0 times avg. 0.5  LDH 119 - 226 IU/L 144  Total CHOL/HDL Ratio 0.0 - 4.4 ratio 3.4  Cholesterol, Total 100 - 199 mg/dL 578  HDL Cholesterol >46 mg/dL 52  Triglycerides 0 - 962 mg/dL 41  VLDL Cholesterol Cal 5 - 40 mg/dL 8  LDL Chol Calc (NIH) 0 - 99 mg/dL 952 (H)  Iron 27 - 841 ug/dL 70  Vitamin D, 32-GMWNUUV 30.0 - 100.0 ng/mL 33.5  Globulin, Total 1.5 - 4.5 g/dL 3.1  WBC 3.4 - 25.3 G64Q0/HK 6.4  RBC 3.77 - 5.28 x10E6/uL 3.85  Hemoglobin 11.1 - 15.9 g/dL 74.2  HCT 59.5 - 63.8 % 35.6  MCV 79 - 97 fL 93  MCH 26.6 - 33.0 pg 30.1  MCHC 31.5 - 35.7 g/dL 75.6  RDW 43.3 - 29.5 % 12.6  Platelets 150 - 450 x10E3/uL 364  Hematology Comments:   Note:  Neutrophils Not Estab. % 63  Immature Granulocytes Not Estab. % 0  NEUT# 1.4 - 7.0 x10E3/uL 4.0  Lymphocyte # 0.7 - 3.1 x10E3/uL 1.9  Monocytes Absolute 0.1 -  0.9 x10E3/uL 0.4  Basophils Absolute 0.0 - 0.2 x10E3/uL 0.0  Immature Grans (Abs) 0.0 - 0.1 x10E3/uL 0.0  Lymphs Not Estab. % 29  Monocytes Not Estab. % 6  Basos Not Estab. % 1  Eos Not Estab. % 1  EOS (ABSOLUTE) 0.0 - 0.4 x10E3/uL 0.1  TSH 0.450 - 4.500 uIU/mL 2.860  Thyroxine (T4) 4.5 - 12.0 ug/dL 8.6  Free Thyroxine Index 1.2 - 4.9  2.2  T3 Uptake Ratio 24 - 39 % 25

## 2023-04-22 ENCOUNTER — Ambulatory Visit
Admission: RE | Admit: 2023-04-22 | Discharge: 2023-04-22 | Disposition: A | Discharge status: Home / Self Care | Payer: No Typology Code available for payment source | Source: Ambulatory Visit | Attending: Radiology | Admitting: Radiology

## 2023-04-22 DIAGNOSIS — Z1231 Encounter for screening mammogram for malignant neoplasm of breast: Secondary | ICD-10-CM

## 2023-05-11 ENCOUNTER — Encounter: Payer: Self-pay | Admitting: Registered Nurse

## 2023-05-11 ENCOUNTER — Telehealth: Payer: Self-pay | Admitting: Registered Nurse

## 2023-05-11 DIAGNOSIS — E876 Hypokalemia: Secondary | ICD-10-CM

## 2023-05-11 MED ORDER — POTASSIUM CHLORIDE CRYS ER 20 MEQ PO TBCR
20.0000 meq | EXTENDED_RELEASE_TABLET | Freq: Every day | ORAL | Status: DC
Start: 2023-05-11 — End: 2023-08-08

## 2023-05-11 NOTE — Telephone Encounter (Signed)
 Last filled 60 tabs potassium chloride  20meq 01/26/2023 Fasting labs due Jan 2025 for Be Well 2026 Labs stable and BP 118/74 at 05/11/22 recheck with RN Kimrey  Patient verbalized understanding information/instructions, agreed with plan of care and had no further questions at this time..     Latest Reference Range & Units 04/30/22 08:35  Sodium 134 - 144 mmol/L 143  Potassium 3.5 - 5.2 mmol/L 4.1  Chloride 96 - 106 mmol/L 100  Glucose 70 - 99 mg/dL 95  BUN 6 - 24 mg/dL 13  Creatinine 9.42 - 8.99 mg/dL 9.24  Calcium 8.7 - 89.7 mg/dL 9.7  BUN/Creatinine Ratio 9 - 23  17  eGFR >59 mL/min/1.73 93  Phosphorus 3.0 - 4.3 mg/dL 3.8  Alkaline Phosphatase 44 - 121 IU/L 86  Albumin 3.8 - 4.9 g/dL 4.3  Albumin/Globulin Ratio 1.2 - 2.2  1.4  Uric Acid 3.0 - 7.2 mg/dL 6.4  AST 0 - 40 IU/L 16  ALT 0 - 32 IU/L 10  Total Protein 6.0 - 8.5 g/dL 7.4  Total Bilirubin 0.0 - 1.2 mg/dL 0.4  GGT 0 - 60 IU/L 22  Estimated CHD Risk 0.0 - 1.0 times avg. 0.5  LDH 119 - 226 IU/L 144  Total CHOL/HDL Ratio 0.0 - 4.4 ratio 3.4  Cholesterol, Total 100 - 199 mg/dL 823  HDL Cholesterol >60 mg/dL 52  Triglycerides 0 - 850 mg/dL 41  VLDL Cholesterol Cal 5 - 40 mg/dL 8  LDL Chol Calc (NIH) 0 - 99 mg/dL 883 (H)  Iron 27 - 840 ug/dL 70  Vitamin D , 25-Hydroxy 30.0 - 100.0 ng/mL 33.5  Globulin, Total 1.5 - 4.5 g/dL 3.1  WBC 3.4 - 89.1 k89Z6/lO 6.4  RBC 3.77 - 5.28 x10E6/uL 3.85  Hemoglobin 11.1 - 15.9 g/dL 88.3  HCT 65.9 - 53.3 % 35.6  MCV 79 - 97 fL 93  MCH 26.6 - 33.0 pg 30.1  MCHC 31.5 - 35.7 g/dL 67.3  RDW 88.2 - 84.5 % 12.6  Platelets 150 - 450 x10E3/uL 364  Hematology Comments:   Note:  Neutrophils Not Estab. % 63  Immature Granulocytes Not Estab. % 0  NEUT# 1.4 - 7.0 x10E3/uL 4.0  Lymphocyte # 0.7 - 3.1 x10E3/uL 1.9  Monocytes Absolute 0.1 - 0.9 x10E3/uL 0.4  Basophils Absolute 0.0 - 0.2 x10E3/uL 0.0  Immature Grans (Abs) 0.0 - 0.1 x10E3/uL 0.0  Lymphs Not Estab. % 29  Monocytes Not Estab. % 6  Basos  Not Estab. % 1  Eos Not Estab. % 1  EOS (ABSOLUTE) 0.0 - 0.4 x10E3/uL 0.1  TSH 0.450 - 4.500 uIU/mL 2.860  Thyroxine (T4) 4.5 - 12.0 ug/dL 8.6  Free Thyroxine Index 1.2 - 4.9  2.2  T3 Uptake Ratio 24 - 39 % 25

## 2023-06-09 ENCOUNTER — Telehealth: Payer: Self-pay | Admitting: Registered Nurse

## 2023-06-09 ENCOUNTER — Encounter: Payer: Self-pay | Admitting: Registered Nurse

## 2023-06-09 DIAGNOSIS — E039 Hypothyroidism, unspecified: Secondary | ICD-10-CM

## 2023-06-09 NOTE — Telephone Encounter (Signed)
Patient stated she would come between 0930-3:30pm Thursday for TSH drawn nonfasting.

## 2023-06-09 NOTE — Telephone Encounter (Signed)
Patient had labs with [facility] Nov 2024 levothyroxine dose decreased to po daily and recheck TSH in 12 weeks.  90 tabs dispensed to patient 03/18/23  Patient verbalized understanding information/instructions agreed with plan of care and had no further questions at this time.   Latest Reference Range & Units 03/09/22 08:44 04/30/22 08:35 03/17/23 16:17  TSH 0.40 - 4.50 mIU/L 0.97 2.860 0.33 (L)  Thyroxine (T4) 5.1 - 11.9 mcg/dL   8.6 9.1  Free Thyroxine Index 1.4 - 3.8    2.2 2.7  T3 Uptake Ratio 24 - 39 %   25    T3 Uptake 22 - 35 %     30

## 2023-06-10 ENCOUNTER — Other Ambulatory Visit: Payer: No Typology Code available for payment source

## 2023-06-10 DIAGNOSIS — Z0000 Encounter for general adult medical examination without abnormal findings: Secondary | ICD-10-CM

## 2023-06-10 DIAGNOSIS — E039 Hypothyroidism, unspecified: Secondary | ICD-10-CM

## 2023-06-10 NOTE — Telephone Encounter (Signed)
Patient had TSH drawn today and last pill taken this am.  Levothyroxine po daily #90 RF0 dispensed to patient today from [facility]  Will send my chart message with results to patient when available from [facility] and copy requested to be sent to [facility]

## 2023-06-10 NOTE — Telephone Encounter (Signed)
Last received 90 tabs levothyroxine 03/18/2023 from [facility] [employer]

## 2023-06-10 NOTE — Progress Notes (Signed)
Labs drawn in Right antecubital without difficulty.  Sent to [facility].

## 2023-06-11 ENCOUNTER — Encounter: Payer: Self-pay | Admitting: Registered Nurse

## 2023-06-11 LAB — CMP12+LP+TP+TSH+6AC+CBC/D/PLT
ALT: 11 [IU]/L (ref 0–32)
AST: 16 [IU]/L (ref 0–40)
Albumin: 4.4 g/dL (ref 3.8–4.9)
Alkaline Phosphatase: 85 [IU]/L (ref 44–121)
BUN/Creatinine Ratio: 15 (ref 9–23)
BUN: 11 mg/dL (ref 6–24)
Basophils Absolute: 0.1 10*3/uL (ref 0.0–0.2)
Basos: 1 %
Bilirubin Total: 0.4 mg/dL (ref 0.0–1.2)
Calcium: 9.5 mg/dL (ref 8.7–10.2)
Chloride: 100 mmol/L (ref 96–106)
Chol/HDL Ratio: 3.1 {ratio} (ref 0.0–4.4)
Cholesterol, Total: 174 mg/dL (ref 100–199)
Creatinine, Ser: 0.74 mg/dL (ref 0.57–1.00)
EOS (ABSOLUTE): 0.1 10*3/uL (ref 0.0–0.4)
Eos: 1 %
Estimated CHD Risk: <0.5 times avg. (ref 0.0–1.0)
Free Thyroxine Index: 1.9 (ref 1.2–4.9)
GGT: 23 [IU]/L (ref 0–60)
Globulin, Total: 2.9 g/dL (ref 1.5–4.5)
Glucose: 107 mg/dL — ABNORMAL HIGH (ref 70–99)
HDL: 57 mg/dL (ref >39)
Hematocrit: 35.6 % (ref 34.0–46.6)
Hemoglobin: 11.6 g/dL (ref 11.1–15.9)
Immature Grans (Abs): 0 10*3/uL (ref 0.0–0.1)
Immature Granulocytes: 0 %
Iron: 76 ug/dL (ref 27–159)
LDH: 155 [IU]/L (ref 119–226)
LDL Chol Calc (NIH): 108 mg/dL — ABNORMAL HIGH (ref 0–99)
Lymphocytes Absolute: 2.2 10*3/uL (ref 0.7–3.1)
Lymphs: 30 %
MCH: 31 pg (ref 26.6–33.0)
MCHC: 32.6 g/dL (ref 31.5–35.7)
MCV: 95 fL (ref 79–97)
Monocytes Absolute: 0.4 10*3/uL (ref 0.1–0.9)
Monocytes: 5 %
Neutrophils Absolute: 4.7 10*3/uL (ref 1.4–7.0)
Neutrophils: 63 %
Phosphorus: 3.9 mg/dL (ref 3.0–4.3)
Platelets: 387 10*3/uL (ref 150–450)
Potassium: 3.7 mmol/L (ref 3.5–5.2)
RBC: 3.74 x10E6/uL — ABNORMAL LOW (ref 3.77–5.28)
RDW: 12.3 % (ref 11.7–15.4)
Sodium: 141 mmol/L (ref 134–144)
T3 Uptake Ratio: 24 % (ref 24–39)
T4, Total: 8 ug/dL (ref 4.5–12.0)
TSH: 1.32 u[IU]/mL (ref 0.450–4.500)
Total Protein: 7.3 g/dL (ref 6.0–8.5)
Triglycerides: 41 mg/dL (ref 0–149)
Uric Acid: 7.1 mg/dL (ref 3.0–7.2)
VLDL Cholesterol Cal: 9 mg/dL (ref 5–40)
WBC: 7.3 10*3/uL (ref 3.4–10.8)
eGFR: 94 mL/min/{1.73_m2} (ref >59)

## 2023-06-11 LAB — HEMOGLOBIN A1C
Est. average glucose Bld gHb Est-mCnc: 108 mg/dL
Hgb A1c MFr Bld: 5.4 % (ref 4.8–5.6)

## 2023-06-16 NOTE — Telephone Encounter (Signed)
 Latest Reference Range & Units 06/10/23 10:26  Sodium 134 - 144 mmol/L 141  Potassium 3.5 - 5.2 mmol/L 3.7  Chloride 96 - 106 mmol/L 100  Glucose 70 - 99 mg/dL 161 (H)  BUN 6 - 24 mg/dL 11  Creatinine 0.96 - 0.45 mg/dL 4.09  Calcium 8.7 - 81.1 mg/dL 9.5  BUN/Creatinine Ratio 9 - 23  15  eGFR >59 mL/min/1.73 94  Phosphorus 3.0 - 4.3 mg/dL 3.9  Alkaline Phosphatase 44 - 121 IU/L 85  Albumin 3.8 - 4.9 g/dL 4.4  Uric Acid 3.0 - 7.2 mg/dL 7.1  AST 0 - 40 IU/L 16  ALT 0 - 32 IU/L 11  Total Protein 6.0 - 8.5 g/dL 7.3  Total Bilirubin 0.0 - 1.2 mg/dL 0.4  GGT 0 - 60 IU/L 23  Estimated CHD Risk 0.0 - 1.0 times avg.  < 0.5  LDH 119 - 226 IU/L 155  Total CHOL/HDL Ratio 0.0 - 4.4 ratio 3.1  Cholesterol, Total 100 - 199 mg/dL 914  HDL Cholesterol >78 mg/dL 57  Triglycerides 0 - 295 mg/dL 41  VLDL Cholesterol Cal 5 - 40 mg/dL 9  LDL Chol Calc (NIH) 0 - 99 mg/dL 621 (H)  Iron 27 - 308 ug/dL 76  Globulin, Total 1.5 - 4.5 g/dL 2.9  WBC 3.4 - 65.7 Q46N6/EX 7.3  RBC 3.77 - 5.28 x10E6/uL 3.74 (L)  Hemoglobin 11.1 - 15.9 g/dL 52.8  HCT 41.3 - 24.4 % 35.6  MCV 79 - 97 fL 95  MCH 26.6 - 33.0 pg 31.0  MCHC 31.5 - 35.7 g/dL 01.0  RDW 27.2 - 53.6 % 12.3  Platelets 150 - 450 x10E3/uL 387  Hematology Comments:  Note:  Neutrophils Not Estab. % 63  Immature Granulocytes Not Estab. % 0  NEUT# 1.4 - 7.0 x10E3/uL 4.7  Lymphs Abs 0.7 - 3.1 x10E3/uL 2.2  Monocytes Absolute 0.1 - 0.9 x10E3/uL 0.4  Basophils Absolute 0.0 - 0.2 x10E3/uL 0.1  Immature Grans (Abs) 0.0 - 0.1 x10E3/uL 0.0  Lymphs Not Estab. % 30  Monocytes Not Estab. % 5  Basos Not Estab. % 1  Eos Not Estab. % 1  EOS (ABSOLUTE) 0.0 - 0.4 x10E3/uL 0.1  Hemoglobin A1C 4.8 - 5.6 % 5.4  Est. average glucose Bld gHb Est-mCnc mg/dL 644  TSH 0.347 - 4.259 uIU/mL 1.320  Thyroxine (T4) 4.5 - 12.0 ug/dL 8.0  Free Thyroxine Index 1.2 - 4.9  1.9  T3 Uptake Ratio 24 - 39 % 24  (H): Data is abnormally high (L): Data is abnormally low  See  results note continue current dose and routine follow up recommended with PCM

## 2023-08-06 ENCOUNTER — Telehealth: Payer: Self-pay | Admitting: Registered Nurse

## 2023-08-06 ENCOUNTER — Encounter: Payer: Self-pay | Admitting: Registered Nurse

## 2023-08-06 DIAGNOSIS — I10 Essential (primary) hypertension: Secondary | ICD-10-CM

## 2023-08-06 DIAGNOSIS — E876 Hypokalemia: Secondary | ICD-10-CM

## 2023-08-06 MED ORDER — POTASSIUM CHLORIDE CRYS ER 20 MEQ PO TBCR
20.0000 meq | EXTENDED_RELEASE_TABLET | Freq: Every day | ORAL | Status: DC
Start: 2023-08-06 — End: 2023-12-07

## 2023-08-06 MED ORDER — HYDROCHLOROTHIAZIDE 12.5 MG PO TABS
12.5000 mg | ORAL_TABLET | Freq: Every day | ORAL | 0 refills | Status: DC
Start: 2023-08-06 — End: 2023-11-09

## 2023-08-06 NOTE — Telephone Encounter (Signed)
 Last filled 05/11/2023 90 day supply  labs stable patient to sign Be Well 2026 paperwork as met requirements labs.  See  Levon Hedger  Dispensed day supply potassium chloride ER po daily and hydrochlorothiazide 12.5mg  po daily from [facility] to patient today.  Patient agreed with plan of care and had no further questions at this time.   Latest Reference Range & Units 06/10/23 10:26  Sodium 134 - 144 mmol/L 141  Potassium 3.5 - 5.2 mmol/L 3.7  Chloride 96 - 106 mmol/L 100  Glucose 70 - 99 mg/dL 161 (H)  BUN 6 - 24 mg/dL 11  Creatinine 0.96 - 0.45 mg/dL 4.09  Calcium 8.7 - 81.1 mg/dL 9.5  BUN/Creatinine Ratio 9 - 23  15  eGFR >59 mL/min/1.73 94  Phosphorus 3.0 - 4.3 mg/dL 3.9  Alkaline Phosphatase 44 - 121 IU/L 85  Albumin 3.8 - 4.9 g/dL 4.4  Uric Acid 3.0 - 7.2 mg/dL 7.1  AST 0 - 40 IU/L 16  ALT 0 - 32 IU/L 11  Total Protein 6.0 - 8.5 g/dL 7.3  Total Bilirubin 0.0 - 1.2 mg/dL 0.4  GGT 0 - 60 IU/L 23  Estimated CHD Risk 0.0 - 1.0 times avg.  < 0.5  LDH 119 - 226 IU/L 155  Total CHOL/HDL Ratio 0.0 - 4.4 ratio 3.1  Cholesterol, Total 100 - 199 mg/dL 914  HDL Cholesterol >78 mg/dL 57  Triglycerides 0 - 295 mg/dL 41  VLDL Cholesterol Cal 5 - 40 mg/dL 9  LDL Chol Calc (NIH) 0 - 99 mg/dL 621 (H)  Iron 27 - 308 ug/dL 76  Globulin, Total 1.5 - 4.5 g/dL 2.9  WBC 3.4 - 65.7 Q46N6/EX 7.3  RBC 3.77 - 5.28 x10E6/uL 3.74 (L)  Hemoglobin 11.1 - 15.9 g/dL 52.8  HCT 41.3 - 24.4 % 35.6  MCV 79 - 97 fL 95  MCH 26.6 - 33.0 pg 31.0  MCHC 31.5 - 35.7 g/dL 01.0  RDW 27.2 - 53.6 % 12.3  Platelets 150 - 450 x10E3/uL 387  Hematology Comments:  Note:  Neutrophils Not Estab. % 63  Immature Granulocytes Not Estab. % 0  NEUT# 1.4 - 7.0 x10E3/uL 4.7  Lymphs Abs 0.7 - 3.1 x10E3/uL 2.2  Monocytes Absolute 0.1 - 0.9 x10E3/uL 0.4  Basophils Absolute 0.0 - 0.2 x10E3/uL 0.1  Immature Grans (Abs) 0.0 - 0.1 x10E3/uL 0.0  Lymphs Not Estab. % 30  Monocytes Not Estab. % 5  Basos Not Estab. % 1  Eos Not Estab. % 1   EOS (ABSOLUTE) 0.0 - 0.4 x10E3/uL 0.1  Hemoglobin A1C 4.8 - 5.6 % 5.4  Est. average glucose Bld gHb Est-mCnc mg/dL 644  (H): Data is abnormally high (L): Data is abnormally low

## 2023-08-08 MED ORDER — POTASSIUM CHLORIDE CRYS ER 20 MEQ PO TBCR: 20.0000 meq | EXTENDED_RELEASE_TABLET | Freq: Every day | ORAL | Status: DC

## 2023-08-08 MED ORDER — HYDROCHLOROTHIAZIDE 12.5 MG PO TABS: 12.5000 mg | ORAL_TABLET | Freq: Every day | ORAL | Status: DC

## 2023-08-08 NOTE — Telephone Encounter (Signed)
 BP 124/76 on 03/17/2023 LDL 108 and Hgba1c 5.4  ewight 185lbs BMI 32.26 met requirements for Be Well 2026 insurance discount but needs to complete tobacco attestation and current height/weight with RN Thersia Flax.  Left message for patient to schedule appt.  NP signed Be Well provider portion of paperwork 08/06/2023.  2025 Be Well results 122/84 BP LDL 116 weight 184lbs BMI 32.2

## 2023-08-11 NOTE — Telephone Encounter (Signed)
 Patient weighed in clinic 08/10/23 185lbs with shoes/jeans/sweatshirt today.  Signed Be Well 2026 ROI and tobacco attestation nonsmoker/vaper in previous 12 months met requirements for insurance discount starting 26 Jan 2024. [facility] form completed/given to HR Tonya and NP signed Be Well paperwork.  Patient notified weight stable from previous year.  Patient reported she has bought new workout standing vibrating plate and increasing her walking to try and lose weight.  Patient verbalized understanding information and had no further questions at that time.

## 2023-09-07 ENCOUNTER — Telehealth: Payer: Self-pay | Admitting: Registered Nurse

## 2023-09-07 ENCOUNTER — Encounter: Payer: Self-pay | Admitting: Registered Nurse

## 2023-09-07 DIAGNOSIS — E039 Hypothyroidism, unspecified: Secondary | ICD-10-CM

## 2023-09-07 MED ORDER — LEVOTHYROXINE SODIUM 50 MCG PO TABS: 50.0000 ug | ORAL_TABLET | Freq: Every day | ORAL | 3 refills | Status: AC

## 2023-09-07 NOTE — Telephone Encounter (Signed)
 Latest Reference Range & Units 06/10/23 10:26  Sodium 134 - 144 mmol/L 141  Potassium 3.5 - 5.2 mmol/L 3.7  Chloride 96 - 106 mmol/L 100  Glucose 70 - 99 mg/dL 119 (H)  BUN 6 - 24 mg/dL 11  Creatinine 1.47 - 8.29 mg/dL 5.62  Calcium 8.7 - 13.0 mg/dL 9.5  BUN/Creatinine Ratio 9 - 23  15  eGFR >59 mL/min/1.73 94  Phosphorus 3.0 - 4.3 mg/dL 3.9  Alkaline Phosphatase 44 - 121 IU/L 85  Albumin 3.8 - 4.9 g/dL 4.4  Uric Acid 3.0 - 7.2 mg/dL 7.1  AST 0 - 40 IU/L 16  ALT 0 - 32 IU/L 11  Total Protein 6.0 - 8.5 g/dL 7.3  Total Bilirubin 0.0 - 1.2 mg/dL 0.4  GGT 0 - 60 IU/L 23  Estimated CHD Risk 0.0 - 1.0 times avg.  < 0.5  LDH 119 - 226 IU/L 155  Total CHOL/HDL Ratio 0.0 - 4.4 ratio 3.1  Cholesterol, Total 100 - 199 mg/dL 865  HDL Cholesterol >78 mg/dL 57  Triglycerides 0 - 469 mg/dL 41  VLDL Cholesterol Cal 5 - 40 mg/dL 9  LDL Chol Calc (NIH) 0 - 99 mg/dL 629 (H)  Iron 27 - 528 ug/dL 76  Globulin, Total 1.5 - 4.5 g/dL 2.9  WBC 3.4 - 41.3 K44W1/UU 7.3  RBC 3.77 - 5.28 x10E6/uL 3.74 (L)  Hemoglobin 11.1 - 15.9 g/dL 72.5  HCT 36.6 - 44.0 % 35.6  MCV 79 - 97 fL 95  MCH 26.6 - 33.0 pg 31.0  MCHC 31.5 - 35.7 g/dL 34.7  RDW 42.5 - 95.6 % 12.3  Platelets 150 - 450 x10E3/uL 387  Hematology Comments:  Note:  Neutrophils Not Estab. % 63  Immature Granulocytes Not Estab. % 0  NEUT# 1.4 - 7.0 x10E3/uL 4.7  Lymphs Abs 0.7 - 3.1 x10E3/uL 2.2  Monocytes Absolute 0.1 - 0.9 x10E3/uL 0.4  Basophils Absolute 0.0 - 0.2 x10E3/uL 0.1  Immature Grans (Abs) 0.0 - 0.1 x10E3/uL 0.0  Lymphs Not Estab. % 30  Monocytes Not Estab. % 5  Basos Not Estab. % 1  Eos Not Estab. % 1  EOS (ABSOLUTE) 0.0 - 0.4 x10E3/uL 0.1  Hemoglobin A1C 4.8 - 5.6 % 5.4  Est. average glucose Bld gHb Est-mCnc mg/dL 387  TSH 5.643 - 3.295 uIU/mL 1.320  Thyroxine (T4) 4.5 - 12.0 ug/dL 8.0  Free Thyroxine Index 1.2 - 4.9  1.9  T3 Uptake Ratio 24 - 39 % 24  (H): Data is abnormally high (L): Data is abnormally low   Patient  last filled levothyroxine  50mcg po daily #90 06/10/2023 from [facility].  Dispensed 90 tabs to patient today from [facility].

## 2023-10-18 ENCOUNTER — Ambulatory Visit: Payer: Self-pay | Admitting: Registered Nurse

## 2023-11-09 ENCOUNTER — Telehealth: Payer: Self-pay | Admitting: Registered Nurse

## 2023-11-09 ENCOUNTER — Encounter: Payer: Self-pay | Admitting: Registered Nurse

## 2023-11-09 DIAGNOSIS — E876 Hypokalemia: Secondary | ICD-10-CM

## 2023-11-09 DIAGNOSIS — I10 Essential (primary) hypertension: Secondary | ICD-10-CM

## 2023-11-09 MED ORDER — POTASSIUM CHLORIDE CRYS ER 20 MEQ PO TBCR: 20.0000 meq | EXTENDED_RELEASE_TABLET | Freq: Every day | ORAL | 0 refills | Status: DC

## 2023-11-09 MED ORDER — HYDROCHLOROTHIAZIDE 12.5 MG PO TABS: 12.5000 mg | ORAL_TABLET | Freq: Every day | ORAL | 0 refills | Status: DC

## 2023-11-09 NOTE — Telephone Encounter (Signed)
 Last filled 08/06/23 90 tabs each. Last BP 124/76 03/17/23 Last labs   Latest Reference Range & Units 06/10/23 10:26  Sodium 134 - 144 mmol/L 141  Potassium 3.5 - 5.2 mmol/L 3.7  Chloride 96 - 106 mmol/L 100  Glucose 70 - 99 mg/dL 892 (H)  BUN 6 - 24 mg/dL 11  Creatinine 9.42 - 8.99 mg/dL 9.25  Calcium 8.7 - 89.7 mg/dL 9.5  BUN/Creatinine Ratio 9 - 23  15  eGFR >59 mL/min/1.73 94  Phosphorus 3.0 - 4.3 mg/dL 3.9  Alkaline Phosphatase 44 - 121 IU/L 85  Albumin 3.8 - 4.9 g/dL 4.4  Uric Acid 3.0 - 7.2 mg/dL 7.1  AST 0 - 40 IU/L 16  ALT 0 - 32 IU/L 11  Total Protein 6.0 - 8.5 g/dL 7.3  Total Bilirubin 0.0 - 1.2 mg/dL 0.4  GGT 0 - 60 IU/L 23  Estimated CHD Risk 0.0 - 1.0 times avg.  < 0.5  LDH 119 - 226 IU/L 155  Total CHOL/HDL Ratio 0.0 - 4.4 ratio 3.1  Cholesterol, Total 100 - 199 mg/dL 825  HDL Cholesterol >60 mg/dL 57  Triglycerides 0 - 850 mg/dL 41  VLDL Cholesterol Cal 5 - 40 mg/dL 9  LDL Chol Calc (NIH) 0 - 99 mg/dL 891 (H)  Iron 27 - 840 ug/dL 76  Globulin, Total 1.5 - 4.5 g/dL 2.9  WBC 3.4 - 89.1 k89Z6/lO 7.3  RBC 3.77 - 5.28 x10E6/uL 3.74 (L)  Hemoglobin 11.1 - 15.9 g/dL 88.3  HCT 65.9 - 53.3 % 35.6  MCV 79 - 97 fL 95  MCH 26.6 - 33.0 pg 31.0  MCHC 31.5 - 35.7 g/dL 67.3  RDW 88.2 - 84.5 % 12.3  Platelets 150 - 450 x10E3/uL 387  Hematology Comments:  Note:  Neutrophils Not Estab. % 63  Immature Granulocytes Not Estab. % 0  NEUT# 1.4 - 7.0 x10E3/uL 4.7  Lymphs Abs 0.7 - 3.1 x10E3/uL 2.2  Monocytes Absolute 0.1 - 0.9 x10E3/uL 0.4  Basophils Absolute 0.0 - 0.2 x10E3/uL 0.1  Immature Grans (Abs) 0.0 - 0.1 x10E3/uL 0.0  Lymphs Not Estab. % 30  Monocytes Not Estab. % 5  Basos Not Estab. % 1  Eos Not Estab. % 1  EOS (ABSOLUTE) 0.0 - 0.4 x10E3/uL 0.1  Hemoglobin A1C 4.8 - 5.6 % 5.4  Est. average glucose Bld gHb Est-mCnc mg/dL 891  TSH 9.549 - 5.499 uIU/mL 1.320  Thyroxine (T4) 4.5 - 12.0 ug/dL 8.0  Free Thyroxine Index 1.2 - 4.9  1.9  T3 Uptake Ratio 24 - 39 % 24   (H): Data is abnormally high (L): Data is abnormally low  Dispensed 30 tabs each to patient today.  Medication for 90 day supply on backorder patient notified and verbalized understanding information.  Hydrochlorothiazide  12.5mg  po daily and potassium chloride  20meq ER po daily

## 2023-11-19 NOTE — Telephone Encounter (Signed)
 Patient notified back ordered potassium chloride  has been received and to notify me when she needs next refill completed.

## 2023-11-30 ENCOUNTER — Telehealth: Payer: Self-pay | Admitting: Registered Nurse

## 2023-11-30 ENCOUNTER — Encounter: Payer: Self-pay | Admitting: Registered Nurse

## 2023-11-30 DIAGNOSIS — E039 Hypothyroidism, unspecified: Secondary | ICD-10-CM

## 2023-11-30 NOTE — Telephone Encounter (Signed)
 Last filled 09/07/23 90 tabs [facility]  last labs  Latest Reference Range & Units 06/10/23 10:26  Sodium 134 - 144 mmol/L 141  Potassium 3.5 - 5.2 mmol/L 3.7  Chloride 96 - 106 mmol/L 100  Glucose 70 - 99 mg/dL 892 (H)  BUN 6 - 24 mg/dL 11  Creatinine 9.42 - 8.99 mg/dL 9.25  Calcium 8.7 - 89.7 mg/dL 9.5  BUN/Creatinine Ratio 9 - 23  15  eGFR >59 mL/min/1.73 94  Phosphorus 3.0 - 4.3 mg/dL 3.9  Alkaline Phosphatase 44 - 121 IU/L 85  Albumin 3.8 - 4.9 g/dL 4.4  Uric Acid 3.0 - 7.2 mg/dL 7.1  AST 0 - 40 IU/L 16  ALT 0 - 32 IU/L 11  Total Protein 6.0 - 8.5 g/dL 7.3  Total Bilirubin 0.0 - 1.2 mg/dL 0.4  GGT 0 - 60 IU/L 23  Estimated CHD Risk 0.0 - 1.0 times avg.  < 0.5  LDH 119 - 226 IU/L 155  Total CHOL/HDL Ratio 0.0 - 4.4 ratio 3.1  Cholesterol, Total 100 - 199 mg/dL 825  HDL Cholesterol >60 mg/dL 57  Triglycerides 0 - 850 mg/dL 41  VLDL Cholesterol Cal 5 - 40 mg/dL 9  LDL Chol Calc (NIH) 0 - 99 mg/dL 891 (H)  Iron 27 - 840 ug/dL 76  Globulin, Total 1.5 - 4.5 g/dL 2.9  WBC 3.4 - 89.1 k89Z6/lO 7.3  RBC 3.77 - 5.28 x10E6/uL 3.74 (L)  Hemoglobin 11.1 - 15.9 g/dL 88.3  HCT 65.9 - 53.3 % 35.6  MCV 79 - 97 fL 95  MCH 26.6 - 33.0 pg 31.0  MCHC 31.5 - 35.7 g/dL 67.3  RDW 88.2 - 84.5 % 12.3  Platelets 150 - 450 x10E3/uL 387  Hematology Comments:  Note:  Neutrophils Not Estab. % 63  Immature Granulocytes Not Estab. % 0  NEUT# 1.4 - 7.0 x10E3/uL 4.7  Lymphs Abs 0.7 - 3.1 x10E3/uL 2.2  Monocytes Absolute 0.1 - 0.9 x10E3/uL 0.4  Basophils Absolute 0.0 - 0.2 x10E3/uL 0.1  Immature Grans (Abs) 0.0 - 0.1 x10E3/uL 0.0  Lymphs Not Estab. % 30  Monocytes Not Estab. % 5  Basos Not Estab. % 1  Eos Not Estab. % 1  EOS (ABSOLUTE) 0.0 - 0.4 x10E3/uL 0.1  Hemoglobin A1C 4.8 - 5.6 % 5.4  Est. average glucose Bld gHb Est-mCnc mg/dL 891  TSH 9.549 - 5.499 uIU/mL 1.320  Thyroxine (T4) 4.5 - 12.0 ug/dL 8.0  Free Thyroxine Index 1.2 - 4.9  1.9  T3 Uptake Ratio 24 - 39 % 24  (H): Data is abnormally  high (L): Data is abnormally low  Dispensed 90 tabs to patient today levothyroxine  50mcg po daily from [facility]  Next labs due May 2026

## 2023-12-07 ENCOUNTER — Other Ambulatory Visit: Payer: Self-pay | Admitting: Registered Nurse

## 2023-12-07 ENCOUNTER — Encounter: Payer: Self-pay | Admitting: Registered Nurse

## 2023-12-07 ENCOUNTER — Telehealth: Payer: Self-pay | Admitting: Registered Nurse

## 2023-12-07 DIAGNOSIS — T50905A Adverse effect of unspecified drugs, medicaments and biological substances, initial encounter: Secondary | ICD-10-CM

## 2023-12-07 DIAGNOSIS — I10 Essential (primary) hypertension: Secondary | ICD-10-CM

## 2023-12-07 MED ORDER — POTASSIUM CHLORIDE CRYS ER 20 MEQ PO TBCR: 20.0000 meq | EXTENDED_RELEASE_TABLET | Freq: Every day | ORAL | 1 refills | Status: DC

## 2023-12-07 MED ORDER — HYDROCHLOROTHIAZIDE 12.5 MG PO TABS: 12.5000 mg | ORAL_TABLET | Freq: Every day | ORAL | 1 refills | Status: DC

## 2023-12-07 NOTE — Telephone Encounter (Signed)
 Last filled 11/09/23 30 day supply as 90 day supply out of stock hydrochlorothiazide  12.5mg  po daily and potassium chloride  20meq ER po daily dispensed 90 pills each to patient today from [facility].  Last BP 124/86 03/17/23 last labs 06/10/23 stable  Latest Reference Range & Units 06/10/23 10:26  Sodium 134 - 144 mmol/L 141  Potassium 3.5 - 5.2 mmol/L 3.7  Chloride 96 - 106 mmol/L 100  Glucose 70 - 99 mg/dL 892 (H)  BUN 6 - 24 mg/dL 11  Creatinine 9.42 - 8.99 mg/dL 9.25  Calcium 8.7 - 89.7 mg/dL 9.5  BUN/Creatinine Ratio 9 - 23  15  eGFR >59 mL/min/1.73 94  Phosphorus 3.0 - 4.3 mg/dL 3.9  Alkaline Phosphatase 44 - 121 IU/L 85  Albumin 3.8 - 4.9 g/dL 4.4  Uric Acid 3.0 - 7.2 mg/dL 7.1  AST 0 - 40 IU/L 16  ALT 0 - 32 IU/L 11  Total Protein 6.0 - 8.5 g/dL 7.3  Total Bilirubin 0.0 - 1.2 mg/dL 0.4  GGT 0 - 60 IU/L 23  Estimated CHD Risk 0.0 - 1.0 times avg.  < 0.5  LDH 119 - 226 IU/L 155  Total CHOL/HDL Ratio 0.0 - 4.4 ratio 3.1  Cholesterol, Total 100 - 199 mg/dL 825  HDL Cholesterol >60 mg/dL 57  Triglycerides 0 - 850 mg/dL 41  VLDL Cholesterol Cal 5 - 40 mg/dL 9  LDL Chol Calc (NIH) 0 - 99 mg/dL 891 (H)  Iron 27 - 840 ug/dL 76  Globulin, Total 1.5 - 4.5 g/dL 2.9  WBC 3.4 - 89.1 k89Z6/lO 7.3  RBC 3.77 - 5.28 x10E6/uL 3.74 (L)  Hemoglobin 11.1 - 15.9 g/dL 88.3  HCT 65.9 - 53.3 % 35.6  MCV 79 - 97 fL 95  MCH 26.6 - 33.0 pg 31.0  MCHC 31.5 - 35.7 g/dL 67.3  RDW 88.2 - 84.5 % 12.3  Platelets 150 - 450 x10E3/uL 387  Hematology Comments:  Note:  Neutrophils Not Estab. % 63  Immature Granulocytes Not Estab. % 0  NEUT# 1.4 - 7.0 x10E3/uL 4.7  Lymphs Abs 0.7 - 3.1 x10E3/uL 2.2  Monocytes Absolute 0.1 - 0.9 x10E3/uL 0.4  Basophils Absolute 0.0 - 0.2 x10E3/uL 0.1  Immature Grans (Abs) 0.0 - 0.1 x10E3/uL 0.0  Lymphs Not Estab. % 30  Monocytes Not Estab. % 5  Basos Not Estab. % 1  Eos Not Estab. % 1  EOS (ABSOLUTE) 0.0 - 0.4 x10E3/uL 0.1  Hemoglobin A1C 4.8 - 5.6 % 5.4  Est. average  glucose Bld gHb Est-mCnc mg/dL 891  TSH 9.549 - 5.499 uIU/mL 1.320  Thyroxine (T4) 4.5 - 12.0 ug/dL 8.0  Free Thyroxine Index 1.2 - 4.9  1.9  T3 Uptake Ratio 24 - 39 % 24  (H): Data is abnormally high (L): Data is abnormally low  next labs and BP due this winter  Patient verbalized understanding information and had no further questions at this time.

## 2023-12-16 ENCOUNTER — Telehealth: Payer: Self-pay | Admitting: Registered Nurse

## 2023-12-16 ENCOUNTER — Encounter: Payer: Self-pay | Admitting: Registered Nurse

## 2023-12-16 DIAGNOSIS — M549 Dorsalgia, unspecified: Secondary | ICD-10-CM

## 2023-12-16 DIAGNOSIS — M722 Plantar fascial fibromatosis: Secondary | ICD-10-CM

## 2023-12-16 MED ORDER — IBUPROFEN 800 MG PO TABS: 800.0000 mg | ORAL_TABLET | Freq: Three times a day (TID) | ORAL | 0 refills | Status: AC | PRN

## 2023-12-16 NOTE — Telephone Encounter (Addendum)
 Last fill December 2024  Labs   Latest Reference Range & Units 06/10/23 10:26  Sodium 134 - 144 mmol/L 141  Potassium 3.5 - 5.2 mmol/L 3.7  Chloride 96 - 106 mmol/L 100  Glucose 70 - 99 mg/dL 892 (H)  BUN 6 - 24 mg/dL 11  Creatinine 9.42 - 8.99 mg/dL 9.25  Calcium 8.7 - 89.7 mg/dL 9.5  BUN/Creatinine Ratio 9 - 23  15  eGFR >59 mL/min/1.73 94  Phosphorus 3.0 - 4.3 mg/dL 3.9  Alkaline Phosphatase 44 - 121 IU/L 85  Albumin 3.8 - 4.9 g/dL 4.4  Uric Acid 3.0 - 7.2 mg/dL 7.1  AST 0 - 40 IU/L 16  ALT 0 - 32 IU/L 11  Total Protein 6.0 - 8.5 g/dL 7.3  Total Bilirubin 0.0 - 1.2 mg/dL 0.4  GGT 0 - 60 IU/L 23  Estimated CHD Risk 0.0 - 1.0 times avg.  < 0.5  LDH 119 - 226 IU/L 155  Total CHOL/HDL Ratio 0.0 - 4.4 ratio 3.1  Cholesterol, Total 100 - 199 mg/dL 825  HDL Cholesterol >60 mg/dL 57  Triglycerides 0 - 850 mg/dL 41  VLDL Cholesterol Cal 5 - 40 mg/dL 9  LDL Chol Calc (NIH) 0 - 99 mg/dL 891 (H)  Iron 27 - 840 ug/dL 76  Globulin, Total 1.5 - 4.5 g/dL 2.9  WBC 3.4 - 89.1 k89Z6/lO 7.3  RBC 3.77 - 5.28 x10E6/uL 3.74 (L)  Hemoglobin 11.1 - 15.9 g/dL 88.3  HCT 65.9 - 53.3 % 35.6  MCV 79 - 97 fL 95  MCH 26.6 - 33.0 pg 31.0  MCHC 31.5 - 35.7 g/dL 67.3  RDW 88.2 - 84.5 % 12.3  Platelets 150 - 450 x10E3/uL 387  Hematology Comments:  Note:  Neutrophils Not Estab. % 63  Immature Granulocytes Not Estab. % 0  NEUT# 1.4 - 7.0 x10E3/uL 4.7  Lymphs Abs 0.7 - 3.1 x10E3/uL 2.2  Monocytes Absolute 0.1 - 0.9 x10E3/uL 0.4  Basophils Absolute 0.0 - 0.2 x10E3/uL 0.1  Immature Grans (Abs) 0.0 - 0.1 x10E3/uL 0.0  Lymphs Not Estab. % 30  Monocytes Not Estab. % 5  Basos Not Estab. % 1  Eos Not Estab. % 1  EOS (ABSOLUTE) 0.0 - 0.4 x10E3/uL 0.1  Hemoglobin A1C 4.8 - 5.6 % 5.4  Est. average glucose Bld gHb Est-mCnc mg/dL 891  TSH 9.549 - 5.499 uIU/mL 1.320  Thyroxine (T4) 4.5 - 12.0 ug/dL 8.0  Free Thyroxine Index 1.2 - 4.9  1.9  T3 Uptake Ratio 24 - 39 % 24  (H): Data is abnormally high (L):  Data is abnormally low  Dispensed 90 tabs to patient from [facility] ibuprofen  800mg  po TID prn pain take with food today.  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  Denied loss of bowel/bladder control, saddle paresthesias or extremity weakness.

## 2024-02-29 ENCOUNTER — Telehealth: Payer: Self-pay | Admitting: Registered Nurse

## 2024-02-29 ENCOUNTER — Encounter: Payer: Self-pay | Admitting: Registered Nurse

## 2024-02-29 DIAGNOSIS — E039 Hypothyroidism, unspecified: Secondary | ICD-10-CM

## 2024-02-29 DIAGNOSIS — I10 Essential (primary) hypertension: Secondary | ICD-10-CM

## 2024-02-29 DIAGNOSIS — T50905A Adverse effect of unspecified drugs, medicaments and biological substances, initial encounter: Secondary | ICD-10-CM

## 2024-02-29 NOTE — Telephone Encounter (Signed)
 Last filled hydrochlorothiazide  12.5mg  and potassium chloride  ER #90 12/07/23 and levothyroxine  8/05 25 #90 po daily  Last labs   Latest Reference Range & Units 06/10/23 10:26  Sodium 134 - 144 mmol/L 141  Potassium 3.5 - 5.2 mmol/L 3.7  Chloride 96 - 106 mmol/L 100  Glucose 70 - 99 mg/dL 892 (H)  BUN 6 - 24 mg/dL 11  Creatinine 9.42 - 8.99 mg/dL 9.25  Calcium 8.7 - 89.7 mg/dL 9.5  BUN/Creatinine Ratio 9 - 23  15  eGFR >59 mL/min/1.73 94  Phosphorus 3.0 - 4.3 mg/dL 3.9  Alkaline Phosphatase 44 - 121 IU/L 85  Albumin 3.8 - 4.9 g/dL 4.4  Uric Acid 3.0 - 7.2 mg/dL 7.1  AST 0 - 40 IU/L 16  ALT 0 - 32 IU/L 11  Total Protein 6.0 - 8.5 g/dL 7.3  Total Bilirubin 0.0 - 1.2 mg/dL 0.4  GGT 0 - 60 IU/L 23  Estimated CHD Risk 0.0 - 1.0 times avg.  < 0.5  LDH 119 - 226 IU/L 155  Total CHOL/HDL Ratio 0.0 - 4.4 ratio 3.1  Cholesterol, Total 100 - 199 mg/dL 825  HDL Cholesterol >60 mg/dL 57  Triglycerides 0 - 850 mg/dL 41  VLDL Cholesterol Cal 5 - 40 mg/dL 9  LDL Chol Calc (NIH) 0 - 99 mg/dL 891 (H)  Iron 27 - 840 ug/dL 76  Globulin, Total 1.5 - 4.5 g/dL 2.9  WBC 3.4 - 89.1 [id number] 7.3  RBC 3.77 - 5.28 x10E6/uL 3.74 (L)  Hemoglobin 11.1 - 15.9 g/dL 88.3  HCT 65.9 - 53.3 % 35.6  MCV 79 - 97 fL 95  MCH 26.6 - 33.0 pg 31.0  MCHC 31.5 - 35.7 g/dL 67.3  RDW 88.2 - 84.5 % 12.3  Platelets 150 - 450 x10E3/uL 387  Hematology Comments:  Note:  Neutrophils Not Estab. % 63  Immature Granulocytes Not Estab. % 0  NEUT# 1.4 - 7.0 x10E3/uL 4.7  Lymphs Abs 0.7 - 3.1 x10E3/uL 2.2  Monocytes Absolute 0.1 - 0.9 x10E3/uL 0.4  Basophils Absolute 0.0 - 0.2 x10E3/uL 0.1  Immature Grans (Abs) 0.0 - 0.1 x10E3/uL 0.0  Lymphs Not Estab. % 30  Monocytes Not Estab. % 5  Basos Not Estab. % 1  Eos Not Estab. % 1  EOS (ABSOLUTE) 0.0 - 0.4 x10E3/uL 0.1  Hemoglobin A1C 4.8 - 5.6 % 5.4  Est. average glucose Bld gHb Est-mCnc mg/dL 891  TSH 9.549 - 5.499 uIU/mL 1.320  Thyroxine (T4) 4.5 - 12.0 ug/dL  8.0  Free Thyroxine Index 1.2 - 4.9  1.9  T3 Uptake Ratio 24 - 39 % 24  (H): Data is abnormally high (L): Data is abnormally low  Next labs due Feb 2026.  Dispensed 90 tabs each to patient today from [facility]  last BP 124/76 03/17/2023  Patient agreed with plan of care and had no further questions at this time.

## 2024-04-13 ENCOUNTER — Ambulatory Visit: Admitting: General Practice

## 2024-04-13 DIAGNOSIS — Z23 Encounter for immunization: Secondary | ICD-10-CM

## 2024-04-13 DIAGNOSIS — L219 Seborrheic dermatitis, unspecified: Secondary | ICD-10-CM | POA: Insufficient documentation

## 2024-04-13 NOTE — Addendum Note (Signed)
 Addended by: BRANDY KARENE BROCKS on: 04/13/2024 03:24 PM   Modules accepted: Orders

## 2024-04-25 ENCOUNTER — Other Ambulatory Visit: Payer: Self-pay | Admitting: Radiology

## 2024-04-25 DIAGNOSIS — Z1231 Encounter for screening mammogram for malignant neoplasm of breast: Secondary | ICD-10-CM

## 2024-05-10 ENCOUNTER — Ambulatory Visit
Admission: RE | Admit: 2024-05-10 | Discharge: 2024-05-10 | Disposition: A | Discharge status: Home / Self Care | Source: Ambulatory Visit | Attending: Radiology | Admitting: Radiology

## 2024-05-10 DIAGNOSIS — Z1231 Encounter for screening mammogram for malignant neoplasm of breast: Secondary | ICD-10-CM

## 2024-05-23 ENCOUNTER — Ambulatory Visit: Admitting: Registered Nurse

## 2024-05-23 ENCOUNTER — Encounter: Payer: Self-pay | Admitting: Registered Nurse

## 2024-05-23 VITALS — Resp 16

## 2024-05-23 DIAGNOSIS — R21 Rash and other nonspecific skin eruption: Secondary | ICD-10-CM

## 2024-05-23 DIAGNOSIS — Z0000 Encounter for general adult medical examination without abnormal findings: Secondary | ICD-10-CM

## 2024-05-23 MED ORDER — HYDROCORTISONE 1 % EX LOTN: 1.0000 | TOPICAL_LOTION | Freq: Two times a day (BID) | CUTANEOUS | Status: AC

## 2024-05-23 MED ORDER — DIPHENHYDRAMINE HCL 2 % EX GEL: 1.0000 | Freq: Two times a day (BID) | CUTANEOUS | Status: AC | PRN

## 2024-05-23 MED ORDER — AQUAPHOR EX OINT: TOPICAL_OINTMENT | Freq: Two times a day (BID) | CUTANEOUS | Status: AC | PRN

## 2024-05-23 NOTE — Progress Notes (Signed)
 "  Established Patient Office Visit  Subjective   Patient ID: Dawn Trevino, female    DOB: 04/19/1966  Age: 59 y.o. MRN: [id number]  Chief Complaint  Patient presents with   Rash    Right arm noted yesterday scratched it and worse today    58y/o african american established patient here for evaluation rash posterior right arm.  Noted yesterday scratched it and seems worse today.  Noticed a blister head yesterday unsure if bit by insect.  Her dog sleeps with her in bed.  She removed all bedding and washed yesterday also no insects seen in bed.  Denied fever/chills/bleeding/discharge/trauma.  Took benadryl  pill this am to help with itching.      Review of Systems  Constitutional:  Negative for chills, diaphoresis, fever and malaise/fatigue.  HENT:  Negative for sore throat.   Eyes:  Negative for pain, discharge and redness.  Respiratory:  Negative for cough, shortness of breath, wheezing and stridor.   Cardiovascular:  Negative for chest pain.  Gastrointestinal:  Negative for diarrhea, nausea and vomiting.  Musculoskeletal:  Negative for joint pain and neck pain.  Skin:  Positive for itching and rash.  Neurological:  Negative for dizziness and headaches.      Objective:     LMP 09/21/2017    Physical Exam Vitals and nursing note reviewed.  Constitutional:      General: She is awake. She is not in acute distress.    Appearance: Normal appearance. She is well-developed and well-groomed. She is obese. She is not ill-appearing, toxic-appearing or diaphoretic.  HENT:     Head: Normocephalic and atraumatic.     Jaw: There is normal jaw occlusion.     Salivary Glands: Right salivary gland is not diffusely enlarged. Left salivary gland is not diffusely enlarged.     Right Ear: Hearing and external ear normal. No decreased hearing noted. No laceration, drainage or swelling.     Left Ear: Hearing and external ear normal. No decreased hearing noted. No laceration, drainage  or swelling.     Nose: Nose normal. No signs of injury, mucosal edema, congestion or rhinorrhea.     Right Turbinates: Not enlarged or swollen.     Left Turbinates: Not enlarged or swollen.     Mouth/Throat:     Lips: Pink. No lesions.     Mouth: Mucous membranes are moist. No oral lesions or angioedema.     Dentition: No gum lesions.     Tongue: No lesions. Tongue does not deviate from midline.     Palate: No mass and lesions.     Pharynx: Oropharynx is clear. Uvula midline. No pharyngeal swelling, oropharyngeal exudate, posterior oropharyngeal erythema or uvula swelling.  Eyes:     General: Lids are normal. Vision grossly intact. Gaze aligned appropriately. No allergic shiner or scleral icterus.       Right eye: No discharge.        Left eye: No discharge.     Extraocular Movements: Extraocular movements intact.     Conjunctiva/sclera: Conjunctivae normal.     Pupils: Pupils are equal, round, and reactive to light.  Neck:     Trachea: Trachea normal.  Cardiovascular:     Rate and Rhythm: Normal rate and regular rhythm.     Pulses: Normal pulses.          Radial pulses are 2+ on the right side and 2+ on the left side.  Pulmonary:     Effort: Pulmonary effort is normal.  No respiratory distress.     Breath sounds: Normal breath sounds and air entry. No stridor or transmitted upper airway sounds. No wheezing.     Comments: Spoke full sentences without difficulty; no cough observed in exam room Abdominal:     General: Abdomen is flat.  Musculoskeletal:        General: Tenderness present. No swelling, deformity or signs of injury. Normal range of motion.     Right upper arm: Swelling, edema and tenderness present. No deformity, lacerations or bony tenderness.     Right elbow: No swelling, deformity, effusion or lacerations. Normal range of motion. No tenderness.     Left elbow: No swelling, deformity, effusion or lacerations. Normal range of motion. No tenderness.     Right hand: No  swelling. Normal strength. Normal capillary refill.     Left hand: No swelling. Normal strength. Normal capillary refill.     Cervical back: Normal range of motion and neck supple. No edema, erythema, signs of trauma, rigidity, torticollis, tenderness or crepitus. No pain with movement. Normal range of motion.  Lymphadenopathy:     Head:     Right side of head: No submandibular or preauricular adenopathy.     Left side of head: No submandibular or preauricular adenopathy.     Cervical: No cervical adenopathy.     Right cervical: No superficial cervical adenopathy.    Left cervical: No superficial cervical adenopathy.  Skin:    General: Skin is warm and dry.     Capillary Refill: Capillary refill takes less than 2 seconds.     Coloration: Skin is not ashen, cyanotic, jaundiced, mottled, pale or sallow.     Findings: Abrasion, erythema and rash present. No abscess, acne, bruising, burn, ecchymosis, signs of injury, laceration, lesion, petechiae or wound. Rash is macular, papular and urticarial. Rash is not crusting, nodular, purpuric, pustular or scaling.         Comments: Papules grouped right medial upper arm proximal to elbow mildly erythematous surrounding papules dry; one papule abraded scabbed and a few with clear fluid tip of papules encased; verbal consent obtained calagel then aquaphor ointment applied and covered with telfa gauze; affected area 2cm x 4 cm  Neurological:     General: No focal deficit present.     Mental Status: She is alert and oriented to person, place, and time. Mental status is at baseline.     GCS: GCS eye subscore is 4. GCS verbal subscore is 5. GCS motor subscore is 6.     Cranial Nerves: No cranial nerve deficit, dysarthria or facial asymmetry.     Sensory: Sensation is intact.     Motor: Motor function is intact. No weakness, tremor, abnormal muscle tone or seizure activity.     Coordination: Coordination is intact. Coordination normal.     Gait: Gait is  intact. Gait normal.     Comments: In/out of chair without difficulty; gait sure and steady in clinic; bilateral hand grasp equal 5/5  Psychiatric:        Attention and Perception: Attention and perception normal.        Mood and Affect: Mood and affect normal.        Speech: Speech normal.        Behavior: Behavior normal. Behavior is cooperative.        Thought Content: Thought content normal.        Cognition and Memory: Cognition and memory normal.        Judgment:  Judgment normal.      No results found for any visits on 05/23/24.    The ASCVD Risk score (Arnett DK, et al., 2019) failed to calculate for the following reasons:   The systolic blood pressure is missing    Assessment & Plan:   Problem List Items Addressed This Visit   None Visit Diagnoses       Rash and nonspecific skin eruption    -  Primary     Did not require oral steroids.   Re-evaluation this afternoon. Given calagel 2% apply BID prn itching and hydrocortisone  1% topical BID prn itching alternate with calagel.  If worsening redness/new discharge.  Avoid scratching.  May apply ice 15 minutes po TID prn pain/swelling.  Exitcare handouts on rash and insect bite. RTC if worsening erythema, pain, purulent discharge, fever. Wash towels, washcloths, sheets in hot water with bleach every couple of days until infection resolved. Wash area with soap and water at least daily. Cover with bandage until healed over.  Given bandaids and hydrocortisone , calagel and aquaphor UD x 2 each from clinic stock.   Patient verbalized understanding, agreed with plan of care and had no further questions at this time.    [facility] Well appt due March patient to scheduled fasting female executive panel and Hgba1c with RN Karene.  Patient agreed with plan of care and had no further questions at this time.  Return if symptoms worsen or fail to improve.    Ellouise DELENA Hope, NP  "

## 2024-05-23 NOTE — Patient Instructions (Signed)
 Insect Bite, Adult An insect bite can make your skin red, itchy, and swollen. An insect bite is different from an insect sting, which happens when an insect injects poison (venom) into the skin. Some insects can spread disease to people through a bite. However, most insect bites do not lead to disease and are not serious. What are the causes? Insects may bite for a variety of reasons, including: Hunger. To defend themselves. Insects that bite include: Spiders. Mosquitoes and flies. Ticks and fleas. Ants. Kissing bugs. Chiggers. What are the signs or symptoms? In many cases, symptoms last for 2-4 days. However, itching can last up to 10 days. Symptoms include: Itching or pain in the bite area. Redness and swelling in the bite area. An open wound (skin ulcer). In rare cases, a person may have a severe allergic reaction (anaphylactic reaction) to a bite. Symptoms of an anaphylactic reaction may include: Feeling warm in the face (flushed). This may include redness. Itchy, red, swollen areas of skin (hives). Swelling of the eyes, lips, face, mouth, tongue, or throat. Wheezing or difficulty breathing, speaking, or swallowing. Dizziness, light-headedness, or fainting. Abdominal symptoms like cramping, nausea, vomiting, or diarrhea. How is this diagnosed? This condition is usually diagnosed based on symptoms and a physical exam. During the exam, your health care provider will look at the bite and ask you what kind of insect bit you. How is this treated? Most insect bites are not serious. Symptoms often go away on their own and treatment is not usually needed. When treatment is recommended, it may include: Applying ice to the affected area. Applying steroid or other anti-itch creams, like calamine lotion, to the bite area. Medicines called antihistamines to reduce itching. You may also need: A tetanus shot if you are not up to date. Antibiotic cream or an oral antibiotic if the bite becomes  infected (this is uncommon). Follow these instructions at home: Bite area care  Do not scratch the bite area. It may help to cover the bite area with a bandage or close-fitting clothing. Keep the bite area clean and dry. Wash it every day with soap and water as told by your health care provider. Check the bite area every day for signs of infection. Check for: More redness, swelling, or pain. Fluid or blood. Warmth. Pus or a bad smell. Managing pain, itching, and swelling  You may apply cortisone cream, calamine lotion, or a paste made of baking soda and water to the bite area as told by your health care provider. If directed, put ice on the bite area. To do this: Put ice in a plastic bag. Place a towel between your skin and the bag. Leave the ice on for 20 minutes, 2-3 times a day. If your skin turns bright red, remove the ice right away to prevent skin damage. The risk of skin damage is higher if you cannot feel pain, heat, or cold. General instructions Apply or take over-the-counter and prescription medicine only as told by your health care provider. If you were prescribed antibiotics, take or apply them as told by your health care provider. Do not stop using the antibiotic even if you start to feel better. How is this prevented? To help reduce your risk of insect bites: When you are outdoors, wear clothing that covers your arms and legs. This is especially important in the early morning and evening. Use insect repellent. The best insect repellents contain DEET, picaridin, oil of lemon eucalyptus (OLE), or IR3535. Consider spraying your clothing  with a pesticide called permethrin. Permethrin helps prevent insect bites. It works for several weeks and for up to 5-6 clothing washes. Do not apply permethrin directly to the skin. If your home windows do not have screens, consider installing them. If you will be sleeping in an area where there are mosquitoes, consider covering your sleeping  area with a mosquito net. Contact a health care provider if: Your bite area has signs of infection, such as: More redness, swelling, or pain. Fluid or blood. Warmth. Pus or a bad smell. You have a fever. Get help right away if: You have a rash. You have muscle or joint pain. You feel unusually tired or weak. You have neck pain or a headache. You develop symptoms of an anaphylactic reaction. These may include: Swelling of the eyes, lips, face, mouth, tongue, or throat. Flushed skin or hives. Wheezing. Difficulty breathing, speaking, or swallowing. Dizziness, light-headedness, or fainting. Abdominal pain, cramping, vomiting, or diarrhea. These symptoms may be an emergency. Get help right away. Call 911. Do not wait to see if the symptoms will go away. Do not drive yourself to the hospital. Summary An insect bite can make your skin red, itchy, and swollen. Treatment is usually not needed. Symptoms often go away on their own. When treatment is recommended, it may involve taking medicine, applying medicine to the area, or applying ice. Apply or take over-the-counter and prescription medicines only as told by your health care provider. Use insect repellent to help prevent insect bites. Contact a health care provider if your bite area has signs of infection. This information is not intended to replace advice given to you by your health care provider. Make sure you discuss any questions you have with your health care provider. Document Revised: 07/23/2021 Document Reviewed: 07/08/2021 Elsevier Patient Education  2024 [employer]Rash, Adult A rash is a breakout of spots or blotches on the skin. It can affect the way the skin looks and feels. Many things can cause a rash. Common causes include: Viral infections. These include colds, measles, and hand, foot, and mouth disease. Bacterial infections. These include scarlet fever and impetigo. Fungal infections. These include athlete's foot,  ringworm, and yeast rashes. Skin irritation. This may be from heat rash, exposure to moisture or friction for a long time (intertrigo), or exposure to soap or skin care products (eczema). Allergic reactions. These may be caused by foods, medicines, or things like poison ivy. Some rashes may go away after a few days. Others may last for a few weeks. The goal of treatment is to stop the itching and keep the rash from spreading. Follow these instructions at home: Medicine Take or apply over-the-counter and prescription medicines only as told by your health care provider. These may include: Corticosteroids. These can help treat red or swollen skin. They may be given as creams or as medicines to take by mouth (orally). Anti-itch lotions. Allergy medicines. Pain medicine. Antifungal medicine if the rash is from a fungal infection. Antibiotics if you have an infection.  Skin care Apply cool, wet cloths (compresses) to the affected areas. Do not scratch or rub your skin. Avoid covering the rash. Keep it exposed to air as often as you can. Managing itching and discomfort Avoid hot showers and baths. These can make itching worse. A cold shower may help. Try taking a bath with: Epsom salts. You can get these at your local pharmacy or grocery store. Follow the instructions on the package. Baking soda. Pour a small amount into  the bath as told by your provider. Colloidal oatmeal. You can get this at your local pharmacy or grocery store. Follow the instructions on the package. Try putting baking soda paste on your skin. Stir water into baking soda until it becomes like a paste. Try using calamine lotion or cortisone cream to help with itchiness. Keep cool. Stay out of the sun. Sweating and being hot can make itching worse. General instructions  Rest as needed. Drink enough fluid to keep your pee (urine) pale yellow. Wear loose-fitting clothes. Avoid scented soaps, detergents, and perfumes. Use  gentle soaps, detergents, perfumes, and cosmetics. Avoid the things that cause your rash (triggers). Keep a journal to help keep track of your triggers. Write down: What you eat. What cosmetics you use. What you drink. What you wear. This includes jewelry. Contact a health care provider if: You sweat at night more than normal. You pee (urinate) more or less than normal, or your pee is a darker color than normal. Your eyes become sensitive to light. Your skin or the white parts of your eyes turn yellow (jaundice). Your skin tingles or is numb. You get painful blisters in your nose or mouth. Your rash does not go away after a few days, or it gets worse. You are more tired or thirsty than normal. You have new or worse symptoms. These may include: Pain in your abdomen. Fever. Diarrhea or vomiting. Weakness or weight loss. Get help right away if: You get confused. You have a severe headache, a stiff neck, or severe joint pain or stiffness. You become very sleepy or not responsive. You have a seizure. This information is not intended to replace advice given to you by your health care provider. Make sure you discuss any questions you have with your health care provider. Document Revised: 01/30/2022 Document Reviewed: 01/30/2022 Elsevier Patient Education  2024 [employer].

## 2024-05-30 ENCOUNTER — Encounter: Payer: Self-pay | Admitting: Registered Nurse

## 2024-05-30 ENCOUNTER — Telehealth: Payer: Self-pay | Admitting: Registered Nurse

## 2024-05-30 DIAGNOSIS — E039 Hypothyroidism, unspecified: Secondary | ICD-10-CM

## 2024-05-30 DIAGNOSIS — T50905A Adverse effect of unspecified drugs, medicaments and biological substances, initial encounter: Secondary | ICD-10-CM

## 2024-05-30 DIAGNOSIS — R21 Rash and other nonspecific skin eruption: Secondary | ICD-10-CM

## 2024-05-30 DIAGNOSIS — I10 Essential (primary) hypertension: Secondary | ICD-10-CM

## 2024-05-30 MED ORDER — HYDROCHLOROTHIAZIDE 12.5 MG PO TABS: 12.5000 mg | ORAL_TABLET | Freq: Every day | ORAL | 0 refills | Status: AC

## 2024-05-30 MED ORDER — POTASSIUM CHLORIDE CRYS ER 20 MEQ PO TBCR: 20.0000 meq | EXTENDED_RELEASE_TABLET | Freq: Every day | ORAL | 0 refills | Status: AC

## 2024-05-30 NOTE — Telephone Encounter (Signed)
 Last filled 02/29/2024 hydrochlorothiazide  12.5mg  and potassium chloride  ER #90 and levothyroxine  50mcg #90 po daily  Last labs     Latest Reference Range & Units 06/10/23 10:26  Sodium 134 - 144 mmol/L 141  Potassium 3.5 - 5.2 mmol/L 3.7  Chloride 96 - 106 mmol/L 100  Glucose 70 - 99 mg/dL 892 (H)  BUN 6 - 24 mg/dL 11  Creatinine 9.42 - 8.99 mg/dL 9.25  Calcium 8.7 - 89.7 mg/dL 9.5  BUN/Creatinine Ratio 9 - 23  15  eGFR >59 mL/min/1.73 94  Phosphorus 3.0 - 4.3 mg/dL 3.9  Alkaline Phosphatase 44 - 121 IU/L 85  Albumin 3.8 - 4.9 g/dL 4.4  Uric Acid 3.0 - 7.2 mg/dL 7.1  AST 0 - 40 IU/L 16  ALT 0 - 32 IU/L 11  Total Protein 6.0 - 8.5 g/dL 7.3  Total Bilirubin 0.0 - 1.2 mg/dL 0.4  GGT 0 - 60 IU/L 23  Estimated CHD Risk 0.0 - 1.0 times avg.  < 0.5  LDH 119 - 226 IU/L 155  Total CHOL/HDL Ratio 0.0 - 4.4 ratio 3.1  Cholesterol, Total 100 - 199 mg/dL 825  HDL Cholesterol >60 mg/dL 57  Triglycerides 0 - 850 mg/dL 41  VLDL Cholesterol Cal 5 - 40 mg/dL 9  LDL Chol Calc (NIH) 0 - 99 mg/dL 891 (H)  Iron 27 - 840 ug/dL 76  Globulin, Total 1.5 - 4.5 g/dL 2.9  WBC 3.4 - 89.1 [id number] 7.3  RBC 3.77 - 5.28 x10E6/uL 3.74 (L)  Hemoglobin 11.1 - 15.9 g/dL 88.3  HCT 65.9 - 53.3 % 35.6  MCV 79 - 97 fL 95  MCH 26.6 - 33.0 pg 31.0  MCHC 31.5 - 35.7 g/dL 67.3  RDW 88.2 - 84.5 % 12.3  Platelets 150 - 450 x10E3/uL 387  Hematology Comments:   Note:  Neutrophils Not Estab. % 63  Immature Granulocytes Not Estab. % 0  NEUT# 1.4 - 7.0 x10E3/uL 4.7  Lymphs Abs 0.7 - 3.1 x10E3/uL 2.2  Monocytes Absolute 0.1 - 0.9 x10E3/uL 0.4  Basophils Absolute 0.0 - 0.2 x10E3/uL 0.1  Immature Grans (Abs) 0.0 - 0.1 x10E3/uL 0.0  Lymphs Not Estab. % 30  Monocytes Not Estab. % 5  Basos Not Estab. % 1  Eos Not Estab. % 1  EOS (ABSOLUTE) 0.0 - 0.4 x10E3/uL 0.1  Hemoglobin A1C 4.8 - 5.6 % 5.4  Est. average glucose Bld gHb Est-mCnc mg/dL 891  TSH 9.549 - 5.499 uIU/mL 1.320  Thyroxine (T4) 4.5 - 12.0 ug/dL 8.0   Free Thyroxine Index 1.2 - 4.9  1.9  T3 Uptake Ratio 24 - 39 % 24  (H): Data is abnormally high (L): Data is abnormally low   Next labs due Feb 2026. Patient notified to schedule Be Well fasting labs and BP/weight check with RN Karene prior to 26 July 2024 Dispensed 90 tabs each to patient today from [facility]  last BP 124/76 03/17/2023  Patient agreed with plan of care and had no further questions at this time.

## 2024-05-31 NOTE — Telephone Encounter (Signed)
 Patient seen in clinic for continued itching medial right arm and patient reported lesions spread from upper arm to hand over the weekend and now resolving.  On inspection visual papules abraded tips driy no erythema/discharge grouped in linear pattern down medial  right arm following ulnar nerve to hand.  Discussed with patient most likely shingles resolving at this time blisters have all dried up no longer infectious to others.  Avoiding scratching as can cause secondary infection.   Patient had taken benadryl  this am.  Given UD calagel, hydrocortisone  and aquaphor ointment from clinic stock today to apply BID prn. May use ice 15 minutes prn itching also or continue oral benadryl  25mg  po q6h prn itching.  Follow up for re-evaluation if further spreading or new symptoms e.g. swelling, red streaks, discharge, pain. Exitcare handout shingles sent to my chart.  Discussed shingles typically occurs unilateral follows nerve starts as tingling then blisters arise then pop and itching very common some persons also with pain/electrical shot sensation along nerve/lesions.  May reoccur if immune system suppressed.  Patient verbalized understanding information/instructions,  agreed with plan of care and had no further questions at this time.
